# Patient Record
Sex: Female | Born: 1950
Health system: Southern US, Community
[De-identification: ages and names within clinical notes are randomized; demographics above are authoritative.]

## PROBLEM LIST (undated history)

## (undated) DIAGNOSIS — E559 Vitamin D deficiency, unspecified: Secondary | ICD-10-CM

## (undated) DIAGNOSIS — E538 Deficiency of other specified B group vitamins: Secondary | ICD-10-CM

## (undated) DIAGNOSIS — E039 Hypothyroidism, unspecified: Secondary | ICD-10-CM

## (undated) DIAGNOSIS — I2699 Other pulmonary embolism without acute cor pulmonale: Secondary | ICD-10-CM

## (undated) DIAGNOSIS — E7800 Pure hypercholesterolemia, unspecified: Secondary | ICD-10-CM

## (undated) DIAGNOSIS — B029 Zoster without complications: Secondary | ICD-10-CM

## (undated) DIAGNOSIS — K2210 Ulcer of esophagus without bleeding: Secondary | ICD-10-CM

## (undated) DIAGNOSIS — K219 Gastro-esophageal reflux disease without esophagitis: Secondary | ICD-10-CM

## (undated) DIAGNOSIS — O039 Complete or unspecified spontaneous abortion without complication: Secondary | ICD-10-CM

## (undated) HISTORY — DX: Hypothyroidism, unspecified: E03.9

## (undated) HISTORY — DX: Complete or unspecified spontaneous abortion without complication: O03.9

## (undated) HISTORY — DX: Vitamin D deficiency, unspecified: E55.9

## (undated) HISTORY — DX: Deficiency of other specified B group vitamins: E53.8

## (undated) HISTORY — DX: Ulcer of esophagus without bleeding: K22.10

## (undated) HISTORY — DX: Gastro-esophageal reflux disease without esophagitis: K21.9

## (undated) HISTORY — DX: Zoster without complications: B02.9

## (undated) HISTORY — PX: TUBAL LIGATION: SHX77

## (undated) HISTORY — DX: Pure hypercholesterolemia, unspecified: E78.00

## (undated) HISTORY — DX: Other pulmonary embolism without acute cor pulmonale: I26.99

---
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
## 1975-10-14 DIAGNOSIS — I2699 Other pulmonary embolism without acute cor pulmonale: Secondary | ICD-10-CM

## 1975-10-14 HISTORY — DX: Other pulmonary embolism without acute cor pulmonale: I26.99

## 1976-10-13 HISTORY — PX: OTHER SURGICAL HISTORY: SHX169

## 1977-10-13 HISTORY — PX: BOWEL RESECTION: SHX1257

## 1999-06-14 ENCOUNTER — Other Ambulatory Visit: Admission: RE | Admit: 1999-06-14 | Discharge: 1999-06-14 | Payer: Self-pay | Admitting: Obstetrics and Gynecology

## 1999-09-18 ENCOUNTER — Encounter (INDEPENDENT_AMBULATORY_CARE_PROVIDER_SITE_OTHER): Payer: Self-pay

## 1999-09-18 ENCOUNTER — Other Ambulatory Visit: Admission: RE | Admit: 1999-09-18 | Discharge: 1999-09-18 | Payer: Self-pay | Admitting: Obstetrics and Gynecology

## 1999-11-12 ENCOUNTER — Encounter: Payer: Self-pay | Admitting: Gastroenterology

## 1999-11-12 ENCOUNTER — Encounter: Admission: RE | Admit: 1999-11-12 | Discharge: 1999-11-12 | Payer: Self-pay | Admitting: Gastroenterology

## 2000-07-17 ENCOUNTER — Other Ambulatory Visit: Admission: RE | Admit: 2000-07-17 | Discharge: 2000-07-17 | Payer: Self-pay | Admitting: Obstetrics and Gynecology

## 2001-12-14 ENCOUNTER — Encounter: Admission: RE | Admit: 2001-12-14 | Discharge: 2001-12-14 | Payer: Self-pay | Admitting: Obstetrics and Gynecology

## 2001-12-14 ENCOUNTER — Encounter: Payer: Self-pay | Admitting: Obstetrics and Gynecology

## 2001-12-27 ENCOUNTER — Ambulatory Visit (HOSPITAL_BASED_OUTPATIENT_CLINIC_OR_DEPARTMENT_OTHER): Admission: RE | Admit: 2001-12-27 | Discharge: 2001-12-27 | Payer: Self-pay | Admitting: Dentistry

## 2002-06-21 ENCOUNTER — Other Ambulatory Visit: Admission: RE | Admit: 2002-06-21 | Discharge: 2002-06-21 | Payer: Self-pay | Admitting: Obstetrics and Gynecology

## 2002-06-28 ENCOUNTER — Encounter: Payer: Self-pay | Admitting: Obstetrics and Gynecology

## 2002-06-28 ENCOUNTER — Encounter: Admission: RE | Admit: 2002-06-28 | Discharge: 2002-06-28 | Payer: Self-pay | Admitting: Obstetrics and Gynecology

## 2002-10-13 HISTORY — PX: BUNIONECTOMY: SHX129

## 2003-07-05 ENCOUNTER — Other Ambulatory Visit: Admission: RE | Admit: 2003-07-05 | Discharge: 2003-07-05 | Payer: Self-pay | Admitting: Obstetrics and Gynecology

## 2003-09-19 ENCOUNTER — Encounter: Admission: RE | Admit: 2003-09-19 | Discharge: 2003-09-19 | Payer: Self-pay | Admitting: Obstetrics and Gynecology

## 2004-08-16 ENCOUNTER — Other Ambulatory Visit: Admission: RE | Admit: 2004-08-16 | Discharge: 2004-08-16 | Payer: Self-pay | Admitting: Obstetrics and Gynecology

## 2005-08-27 ENCOUNTER — Other Ambulatory Visit: Admission: RE | Admit: 2005-08-27 | Discharge: 2005-08-27 | Payer: Self-pay | Admitting: Obstetrics and Gynecology

## 2005-09-30 ENCOUNTER — Encounter: Admission: RE | Admit: 2005-09-30 | Discharge: 2005-09-30 | Payer: Self-pay | Admitting: Obstetrics and Gynecology

## 2005-11-04 ENCOUNTER — Ambulatory Visit: Payer: Self-pay | Admitting: Oncology

## 2006-08-27 ENCOUNTER — Other Ambulatory Visit: Admission: RE | Admit: 2006-08-27 | Discharge: 2006-08-27 | Payer: Self-pay | Admitting: Obstetrics and Gynecology

## 2006-10-27 ENCOUNTER — Encounter: Admission: RE | Admit: 2006-10-27 | Discharge: 2006-10-27 | Payer: Self-pay | Admitting: Obstetrics and Gynecology

## 2007-08-31 ENCOUNTER — Other Ambulatory Visit: Admission: RE | Admit: 2007-08-31 | Discharge: 2007-08-31 | Payer: Self-pay | Admitting: Obstetrics and Gynecology

## 2008-01-26 ENCOUNTER — Encounter: Admission: RE | Admit: 2008-01-26 | Discharge: 2008-01-26 | Payer: Self-pay | Admitting: Obstetrics and Gynecology

## 2008-09-13 ENCOUNTER — Other Ambulatory Visit: Admission: RE | Admit: 2008-09-13 | Discharge: 2008-09-13 | Payer: Self-pay | Admitting: Obstetrics and Gynecology

## 2009-09-17 ENCOUNTER — Other Ambulatory Visit: Admission: RE | Admit: 2009-09-17 | Discharge: 2009-09-17 | Payer: Self-pay | Admitting: Obstetrics and Gynecology

## 2010-09-16 ENCOUNTER — Encounter: Admission: RE | Admit: 2010-09-16 | Discharge: 2010-09-16 | Payer: Self-pay | Admitting: Internal Medicine

## 2011-02-28 NOTE — Op Note (Signed)
[place]  Patient:    GWENNA, FUSTON Visit Number: [id number] MRN: [id number]          Service Type: DSU Location: [facility] Attending Physician:  Sharin Grave Dictated by:   Cherly Anderson, D.D.S. Proc. Date: 12/27/01 Admit Date:  12/27/2001                             Operative Report  PREOPERATIVE DIAGNOSIS:  Partial right maxillary edentulism, periodontal disease.  POSTOPERATIVE DIAGNOSIS:  Partial right maxillary edentulism, periodontal disease.  PROCEDURES: 1. Harvest of autologous bone from right iliac crest. 2. Right sinus lift procedure. 3. Gingival augmentation of edentulous tooth area #7 with Alloderm.  SURGEON:  Cherly Anderson, D.D.S.  ANESTHESIA:  General anesthesia.  INDICATIONS FOR PROCEDURE:  The patient has been evaluated for dental reconstruction of the right maxilla where she is edentulous.  She is missing teeth posterior to tooth #6 in the right maxilla and she is also edentulous in the area of tooth #7.  The right maxillary sinus is completely ______ to the alveolar ridge and the alveolar ridge has undergone a significant amount of vertical space from the right maxillary alveolar ridge to the imposing dentition.  Following consultations with myself and her general dentist, we explained to Emanie that there is inadequate vertical bone in the right maxillary posterior region for placement of dental implants.  Considering the fact that there is also a significant amount of vertical space from the alveolar ridge to the imposing dentition, she would require augmentation to increase the height of the right maxilla with a sinus lift and would also require onlay bone grafting to the right posterior maxillary alveolus to decrease the vertical space and, therefore, provide more aesthetic dental implant prostheses.  Because the onlay bone grafting would require better vascularity for more predictable success of that procedure,  the sinus lift procedure would have to be performed with autogenous bone which would provide a more vascular bone graft more quickly.  It was determined that she would have the bone harvested from the right iliac crest combined with platelet root to plasma, as well as free stride bone to augment the right maxillary sinus. Once adequate time for healing was allowed, she would then have the alveolar ridge augmented with a cortical graft to be completed approximately six months from the time of the sinus lift procedure.  Evaluation of edentulous area #7 also was found to have inadequate soft tissue for optimal aesthetics in a pontic region of tooth #7.  She has had other procedures previously to vertically augment the soft tissue.  I explained to her that vertically augmenting the soft tissues are very difficult and we would try to augment the soft tissue with an Alloderm graft.  She understands the limited predictability of this procedure, however wishes to proceed to achieve any increase in aesthetics if possible.  DESCRIPTION OF PROCEDURE:  The patient was brought to the operating room and placed in the supine position.  Once general anesthesia was induced via nasotracheal intubation, the patient was prepped and draped for a maxillofacial procedure of this type.  Initially, the right hip region was isolated.  The anterior iliac spine was palpated and identified with a marker. The iliac crest was also palpated and identified with a marker.  The skin was then retracted medially and superiorly to allow for lateral placement of the incision and again the iliac spine and crest  were identified with marks.  Two centimeters posterior to the anterior iliac spine, the incision was initiated and continued posteriorly and superiorly approximately 5 cm.  This incision was made with a #15 blade, continued through the skin and subcutaneous tissue. Bleeding was controlled with electrocautery Bovie.  The  incision continued with repeated palpation of the iliac crest down through fascia through the periosteum along the crest in the mid crestal region.  An incision was made through the periosteum approximately 4 cm in length, again making sure that the incision stopped approximately 2 cm posterior to the anterior iliac spine. Minimal reflection of the periosteum medially was created maintaining soft tissue attachment on the medial surface of the iliac crest.  Osteotomes were then utilized to create osteotomies approximately 2 cm apart.  These osteotomies continued to a depth of approximately 15 mm.  The osteotomies were then connected with osteotomy along the mid crestal region.  The medial cortex bone was then outfractured thus exposing the cancellous bone.  Gouges and scoops were then utilized to harvest the cancellous bone totalling approximately 5-6 cc of the autogenous bone.  Care was taken to avoid perforation of the medial and lateral cortex.  No significant bleeding was encountered.  The site was irrigated with normal saline and suctioned dry. The site was then evaluated and found to have minimal oozing of blood. Therefore, Avitene was placed in the iliac crest region and the cortical bone was then reapproximated and sutured to the iliac crest utilizing 2-0 Vicryl suture.  The fascial layers were then closed utilizing 3-0 Vicryl suture.  The deep subcutaneous tissue was closed utilizing 3-0 Vicryl suture.  The more superficial subcutaneous tissue was also closed utilizing 4-0 Vicryl suture. A subcuticular suture consisting of 5-0 Prolene was then utilized to close the skin.  This concluded the procedure regarding the harvesting of the autogenous graft.  Prior to initiation of harvesting the graft, approximately 60 cc of blood was collected from the patient from the right antecubital fossa.  This blood was then placed into a special centrifuge to spin and create layers of the blood so  that the platelet-rich plasma layer could be harvested.  The platelet-rich plasma was then placed into delivery device and placed in the field to be used  following preparation of the sinus lift.  Attention was then directed toward the right maxillary sinus lift procedure. A #15 blade was utilized to create an incision through the gingiva posterior to tooth #6 with an anterior and posterior releasing site.  A subperiosteal flap was created superiorly exposing the zygomatic buttress and right infraorbital neurovascular bundle.  The tissue was reflected and a rotary osteotome with copious amounts of normal saline irrigation was utilized to create a window in the lateral wall of the sinus.  Curets were then utilized to infracture this bony wall and to gently tease the sinus membrane from around the bony window and continuing anteriorly to the anterior extent at the maxillary sinus and along the floor of the maxillary sinus attempting to carefully reflect the tissue without damaging it.  A 1 cm hole in the membrane was noted and the tissue was again reflected along the medial wall of the maxillary sinus and tissue was maintained superiorly.  A biomend membrane was soaked in the platelet-rich plasma and cut to fit passively within the maxillary sinus.  Carefully reflecting the damaged membrane superiorly, the hole was repaired utilizing the biomend membrane with platelet-rich plasma. Once in place, the entire sinus was  visualized and found to have no fragments of the membrane and the hole was repaired successfully.  The 5.5 cc of the autogenous bone from the hip was then mixed with 2 cc of porous freeze-dried bone and 2 cc of ______.  Once this mixture was accomplished, the bone graft mixture was then impregnated with the platelet-rich plasma.  Once this was allowed to set and congeal, the graft material was again coated on the opposite side.  The graft material was then placed carefully into  the right maxillary sinus compressing the graft material initially in the anterior and inferior portions of the maxillary sinus.  Graft material was then carefully placed in the posterior direction again compressing the graft material medially and inferiorly.  The graft material was measured and found to have created approximately 15 mm of height from the alveolar ridge.  No holes in the repaired sinus membrane were noted.  A second piece of biomend membrane was then soaked in platelet-rich plasma and cut to fit across the bony window in the lateral wall of the maxillary sinus.  This was stabilized utilizing KLS cruciform screws approximately 3 mm in length.  Three screws were placed.  The site was gently irrigated with normal saline to remove any loose bone graft material and the soft tissue was then released and closed passively over this surgical site utilizing 4-0 Chromic suture.  Attention was then directed toward the alloderm gingival augmentation procedure area #7.  A multiple unit dental bridge was removed carefully attempting to avoid damage to the porcelain.  The edentulous area of tooth #7 was incised midcrestal with extensions to the sulcular region of both teeth #6 and #8.  The flap was gently reflected and a split-thickness flap was created both in the facial and palatal surfaces in this region.  The small soft tissue pocket was evaluated and found to be adequate for replacement of the alloderm graft material.  The alloderm graft material was also soaked in platelet-rich plasma and then gently placed within the soft tissue pocket overlying the ridge and continued to the palatal surface.  The soft tissue was then closed utilizing 4-0 Chromic suture.  Once all the oral procedures were completed, the right hip graft site was again evaluated and found to have no excessive bleeding.  Sterile gloves were utilized to apply Bacitracin to the incision site.  Steri-Strips were  then placed and a Telfa pad was used to cover the site.  A pressure dressing was then placed utilizing elastoplast material.  It should be noted that proximally, 1.5 cc of 0.5% Marcaine was injected in the right hip site preoperatively and 3 cc of 0.5% Marcaine with 1:200,000 was also injected into the right maxillary region prior to initiation of that procedure.  The procedure was concluded.  The oropharyngeal throat pack was removed.  The patient was allowed to awaken in the operating room and extubated without complications.  She was then transferred to recovery room in stable, satisfactory condition. Dictated by:   Cherly Anderson, D.D.S. Attending Physician:  Sharin Grave DD:  12/27/01 TD:  12/28/01 Job: [id number]

## 2011-09-29 ENCOUNTER — Other Ambulatory Visit: Payer: Self-pay | Admitting: Internal Medicine

## 2011-09-29 DIAGNOSIS — Z1231 Encounter for screening mammogram for malignant neoplasm of breast: Secondary | ICD-10-CM

## 2011-11-20 ENCOUNTER — Other Ambulatory Visit: Payer: Self-pay | Admitting: Obstetrics & Gynecology

## 2011-11-20 DIAGNOSIS — Z1231 Encounter for screening mammogram for malignant neoplasm of breast: Secondary | ICD-10-CM

## 2011-12-03 ENCOUNTER — Ambulatory Visit
Admission: RE | Admit: 2011-12-03 | Discharge: 2011-12-03 | Disposition: A | Discharge status: Home / Self Care | Payer: BC Managed Care – PPO | Source: Ambulatory Visit | Attending: Obstetrics & Gynecology | Admitting: Obstetrics & Gynecology

## 2011-12-03 DIAGNOSIS — Z1231 Encounter for screening mammogram for malignant neoplasm of breast: Secondary | ICD-10-CM

## 2012-10-13 DIAGNOSIS — B029 Zoster without complications: Secondary | ICD-10-CM

## 2012-10-13 HISTORY — DX: Zoster without complications: B02.9

## 2012-10-29 ENCOUNTER — Other Ambulatory Visit: Payer: Self-pay | Admitting: Obstetrics & Gynecology

## 2012-10-29 DIAGNOSIS — Z1231 Encounter for screening mammogram for malignant neoplasm of breast: Secondary | ICD-10-CM

## 2012-12-03 ENCOUNTER — Ambulatory Visit: Payer: BC Managed Care – PPO

## 2012-12-20 ENCOUNTER — Ambulatory Visit
Admission: RE | Admit: 2012-12-20 | Discharge: 2012-12-20 | Disposition: A | Discharge status: Home / Self Care | Payer: BC Managed Care – PPO | Source: Ambulatory Visit | Attending: Obstetrics & Gynecology | Admitting: Obstetrics & Gynecology

## 2013-03-24 ENCOUNTER — Ambulatory Visit (INDEPENDENT_AMBULATORY_CARE_PROVIDER_SITE_OTHER): Payer: BC Managed Care – PPO | Admitting: Neurology

## 2013-03-24 ENCOUNTER — Encounter: Payer: Self-pay | Admitting: Neurology

## 2013-03-24 VITALS — BP 114/76 | HR 62 | Temp 98.5°F | Ht 66.0 in | Wt 150.0 lb

## 2013-03-24 DIAGNOSIS — G478 Other sleep disorders: Secondary | ICD-10-CM

## 2013-03-24 DIAGNOSIS — G4710 Hypersomnia, unspecified: Secondary | ICD-10-CM

## 2013-03-24 NOTE — Assessment & Plan Note (Signed)
Supine AHi was over 20- [employer].

## 2013-03-24 NOTE — Progress Notes (Signed)
[facility]  Provider:  Dr Vickey Huger Referring Provider: No ref. provider found Primary Care Physician:  Ezequiel Kayser, MD  Chief Complaint  Patient presents with  . Follow-up    central sleep apnea, rm 10    HPI:   Veronica [name] is a 62 y.o. female here as a referral from Dr.Perini  for hypersomnia, persistent.   She was seen  07-21-12 for an initial consult, underwent a sleep study on  08-17-12 , which revealed an AHI of 6.0, mostly obstructive -and  RDI of 10.5 /hr. ,at an  Epworth of 17.  High PLM index  61 /hr a but never had clinical symptom  and was surprised to learn about this.  She  Had mild anemia, was started on Metanx and iron supplements po, but had no relief of hypersomnia. She intended and succeeded with weight loss. Was 159 pounds pre PSG and now 145 pounds . She uses prn Ambien to go to sleep , about 3-4 times a month. Feels anxious often, and  is very detail oriented, has high expectations.    Patient describes her bedtime habit as follows, she goes to bed about midnight and wakes up around 7 AM spontaneously rarely needs the alarm. She will have one bathroom break between 2:30 and 3 AM, and sometimes is difficult for her to be initiate sleep. She likes to take 20 minutes nap, feels refreshed.  Average sleep time  Now is 6.5 hours.  Has been averaging a sleep time of 4-5 hours for most of her life, felt refreshed .No vivid dreams, nightmares, no dream intrusions, cataplexy , narcoleptic attacks.    Her sleepiness may have been present for the last 4 years also. Her rheumatalgical panels were normal, TSH, Hgb A1c , and  Sed rate, CK. , ANA. She has no shift work history and no history of childhood was scored age hypersomnia.no retrognathia, no neck injuries, no tonsillary hypertrophy, nasal airflow restriction.  She has 3 children and her youngest son suffers from sleep apnea and has a CPAP. He has great trouble initiating sleep , is chronically  insomniac . He underwent many ENT procedures, is now 89.      Out of a complete 14 system review, the patient complains of only the following symptoms, and all other reviewed systems are negative.   Epworth 17 points again.     History   Social History  . Marital Status: Married    Spouse Name: N/A    Number of Children: 2  . Years of Education: 14   Occupational History  . retired     Therapist, sports   Social History Main Topics  . Smoking status: Former Games developer  . Smokeless tobacco: Not on file     Comment: quit in 1978  . Alcohol Use: Yes     Comment: 2 drinks per week  . Drug Use: No  . Sexually Active: Not on file   Other Topics Concern  . Not on file   Social History Narrative  . No narrative on file    Family History  Problem Relation Age of Onset  . Transient ischemic attack Mother   . Arthritis Mother   . Hypertension Mother   . Ulcers Mother   . COPD Mother   . Cancer - Lung Father   . Diabetes Sister     whooping cough  . Diabetes Paternal Aunt   . Diabetes Paternal Uncle   . Cancer Paternal Grandmother  esophageal    Past Medical History  Diagnosis Date  . Vitamin B12 deficiency     due to small intestine resection, malabsorption , on shots  . High cholesterol   . Miscarriage     C3P2  . Menarche     age 89 and menopause at age 43  . Erosive esophagitis     on nexium since, with good GERD relief  . GERD (gastroesophageal reflux disease)     Past Surgical History  Procedure Laterality Date  . Appendectomy  1968  . Laparascopy  1978  . Bowel resection  1979    for scar tissue and bowel obstruction.near junction of colon and small bowel per patient  . Bunionectomy  2004    scars on both feet    Current Outpatient Prescriptions  Medication Sig Dispense Refill  . Cyanocobalamin 1000 MCG SUBL Place under the tongue. One injection per month      . levothyroxine (SYNTHROID) 50 MCG tablet Take 50 mcg by mouth daily before  breakfast. daily      . valACYclovir (VALTREX) 1000 MG tablet Take 1,000 mg by mouth daily as needed.      . zolpidem (AMBIEN) 10 MG tablet Take 10 mg by mouth at bedtime as needed for sleep. Take 1/2 tablet daily       No current facility-administered medications for this visit.    Allergies as of 03/24/2013  . (Not on File)    Vitals: BP 114/76  Pulse 62  Temp(Src) 98.5 F (36.9 C) (Oral)  Ht 5\' 6"  (1.676 m)  Wt 150 lb (68.04 kg)  BMI 24.22 kg/m2 Last Weight:  Wt Readings from Last 1 Encounters:  03/24/13 150 lb (68.04 kg)   Last Height:   Ht Readings from Last 1 Encounters:  03/24/13 5\' 6"  (1.676 m)     Physical exam:  General: The patient is awake, alert and appears not in acute distress. The patient is well groomed. Head: Normocephalic, atraumatic. Neck is supple. Mallampati 2 , neck circumference: 12.25 inches , Becks score was 10 . Cardiovascular:  Regular rate and rhythm, without  murmurs or carotid bruit, and without distended neck veins. Respiratory: Lungs are clear to auscultation. Skin:  Without evidence of edema, or rash Trunk: BMI is normal posture.  Neurologic exam : The patient is awake and alert, oriented to place and time.  Memory subjective  described as intact. There is a normal attention span & concentration ability. Speech is fluent without   dysarthria, dysphonia or aphasia. Mood and affect are appropriate.  Cranial nerves: Pupils are equal and briskly reactive to light. Funduscopic exam without   evidence of pallor or edema. Extraocular movements  in vertical and horizontal planes intact and without nystagmus. Visual fields by finger perimetry are intact. Hearing to finger rub intact.  Facial sensation intact to fine touch. Facial motor strength is symmetric and tongue and uvula move midline.  Motor exam:   Normal tone and normal muscle bulk and symmetric normal strength in all extremities.  Sensory:  Fine touch, pinprick and vibration were  tested in all extremities. Proprioception is tested in the upper extremities only. This was   normal.  Coordination: Rapid alternating movements in the fingers/hands is tested and normal. Finger-to-nose maneuver tested and normal without evidence of ataxia, dysmetria or tremor.  Gait and station: Patient walks without assistive device and is able and assisted stool climb up to the exam table. Strength within normal limits. Stance is stable and normal.  Tandem gait is unfragmented. \  Deep tendon reflexes: in the  upper and lower extremities are symmetric and intact. Babinski maneuver response is  downgoing.   Assessment:  After physical and neurologic examination, review of laboratory studies, imaging, neurophysiology testing and pre-existing records, assessment will be reviewed on the problem list.   Persistent hypersomnia with an Epworth sleepiness score are again at 17 points, no symptomatic evidence of restless legs or PLM disorder, In spite of the high PLM index during her sleep study.  Minor AHI , some UARS- sleeps on the side, avoid supine. Tennisball methode explained. No bruxism , but  snoring  Is present - she will consider a dental device if the Tennisball doesn't work.    Plan:  Treatment plan : see above . Patient declined Nuvigil.

## 2013-03-24 NOTE — Patient Instructions (Signed)
Hypersomnia Hypersomnia usually brings recurrent episodes of excessive daytime sleepiness or prolonged nighttime sleep. It is different than feeling tired due to lack of or interrupted sleep at night. People with hypersomnia are compelled to nap repeatedly during the day. This is often at inappropriate times such as:  At work.  During a meal.  In conversation. These daytime naps usually provide no relief. This disorder typically affects adolescents and young adults. CAUSES  This condition may be caused by:  Another sleep disorder (such as narcolepsy or sleep apnea).  Dysfunction of the autonomic nervous system.  Drug or alcohol abuse.  A physical problem, such as:  A tumor.  Head trauma. This is damage caused by an accident.  Injury to the central nervous system.  Certain medications, or medicine withdrawal.  Medical conditions may contribute to the disorder, including:  Multiple sclerosis.  Depression.  Encephalitis.  Epilepsy.  Obesity.  Some people appear to have a genetic predisposition to this disorder. In others, there is no known cause. SYMPTOMS   Patients often have difficulty waking from a long sleep. They may feel dazed or confused.  Other symptoms may include:  Anxiety.  Increased irritation (inflammation).  Decreased energy.  Restlessness.  Slow thinking.  Slow speech.  Loss of appetite.  Hallucinations.  Memory difficulty.  Tremors, Tics.  Some patients lose the ability to function in family, social, occupational, or other settings. TREATMENT  Treatment is symptomatic in nature. Stimulants and other drugs may be used to treat this disorder. Changes in behavior may help. For example, avoid night work and social activities that delay bed time. Changes in diet may offer some relief. Patients should avoid alcohol and caffeine. PROGNOSIS  The likely outcome (prognosis) for persons with hypersomnia depends on the cause of the disorder.  The disorder itself is not life threatening. But it can have serious consequences. For example, automobile accidents can be caused by falling asleep while driving. The attacks usually continue indefinitely. Document Released: 09/19/2002 Document Revised: 12/22/2011 Document Reviewed: 08/23/2008 [employer] Patient Information 2014 [employer].  

## 2013-07-08 ENCOUNTER — Encounter: Payer: Self-pay | Admitting: Obstetrics & Gynecology

## 2013-11-02 ENCOUNTER — Encounter: Payer: Self-pay | Admitting: Obstetrics & Gynecology

## 2014-02-01 ENCOUNTER — Ambulatory Visit: Payer: Self-pay | Admitting: Obstetrics & Gynecology

## 2014-02-03 ENCOUNTER — Ambulatory Visit: Payer: Self-pay | Admitting: Obstetrics & Gynecology

## 2014-02-13 ENCOUNTER — Ambulatory Visit (INDEPENDENT_AMBULATORY_CARE_PROVIDER_SITE_OTHER): Payer: BC Managed Care – PPO | Admitting: Obstetrics & Gynecology

## 2014-02-13 ENCOUNTER — Encounter: Payer: Self-pay | Admitting: Obstetrics & Gynecology

## 2014-02-13 VITALS — BP 104/58 | HR 60 | Resp 16 | Ht 65.25 in | Wt 132.0 lb

## 2014-02-13 DIAGNOSIS — Z0000 Encounter for general adult medical examination without abnormal findings: Secondary | ICD-10-CM

## 2014-02-13 DIAGNOSIS — Z124 Encounter for screening for malignant neoplasm of cervix: Secondary | ICD-10-CM

## 2014-02-13 DIAGNOSIS — Z01419 Encounter for gynecological examination (general) (routine) without abnormal findings: Secondary | ICD-10-CM

## 2014-02-13 LAB — POCT URINALYSIS DIPSTICK
BILIRUBIN UA: NEGATIVE
Glucose, UA: NEGATIVE
Ketones, UA: NEGATIVE
LEUKOCYTES UA: NEGATIVE
NITRITE UA: NEGATIVE
PH UA: 5
PROTEIN UA: NEGATIVE
Urobilinogen, UA: NEGATIVE

## 2014-02-13 MED ORDER — ZOLPIDEM TARTRATE 1.75 MG SL SUBL: 1.0000 | SUBLINGUAL_TABLET | Freq: Every evening | SUBLINGUAL | Status: DC | PRN

## 2014-02-13 MED ORDER — VALACYCLOVIR HCL 1 G PO TABS: 1000.0000 mg | ORAL_TABLET | Freq: Every day | ORAL | Status: DC | PRN

## 2014-02-13 NOTE — Progress Notes (Signed)
63 y.o. G2P2 Widowed CaucasianF here for annual exam.  Husband died in plane crash last year.  Was piloting his own private plane.  He asked patient to fly with him that day.  She does have some guilt about this.  Patient had shingles two weeks after her husband died.  Reports she is going to [place] to be with step daughter.  Her step-son died of a heroine overdose yesterday.  Just having a really hard year.  Seeing a counselor because of the added stress of the year.  Sleeping is ok.  Being very careful about sleeping medication.  She is aware that it would not take very much for her to use more medication that she feels comfortable with.  So, she doesn't take any.    PCP is Dr. Joylene Draft.  Doing paleo diet.  Has lost some weight due to this and some due to husband's death.  Patient's last menstrual period was 10/13/2002.          Sexually active: no  The current method of family planning is tubal ligation.    Exercising: yes  walking, strength training, and yoga Smoker:  Former smoker  Health Maintenance: Pap:  11/13/11 WNL/negative HR HPV History of abnormal Pap:  no MMG:  12/20/12 normal, knows is due Colonoscopy:  2012-repeat in 5 years BMD:   2014 TDaP:  Within the last 2-3 years with Dr Joylene Draft Screening Labs: with Dr. Joylene Draft, Hb today: 13.2, Urine today: RBC-trace   reports that she has quit smoking. She has never used smokeless tobacco. She reports that she drinks about 1 - 1.5 ounces of alcohol per week. She reports that she does not use illicit drugs.  Past Medical History  Diagnosis Date  . Vitamin B12 deficiency     due to small intestine resection, malabsorption , on shots  . High cholesterol   . Miscarriage     C3P2  . Menarche     age 60 and menopause at age 53  . Erosive esophagitis     on nexium since, with good GERD relief  . GERD (gastroesophageal reflux disease)   . Hypothyroid   . PE (pulmonary embolism) 1977    secondary falling down stairs with subsequent  renal failure  . Vitamin D deficiency   . Shingles 2014    Past Surgical History  Procedure Laterality Date  . Appendectomy  1968  . Laparascopy  1978  . Bowel resection  1979    for scar tissue and bowel obstruction.near junction of colon and small bowel per patient  . Bunionectomy  2004    scars on both feet  . Tubal ligation      Current Outpatient Prescriptions  Medication Sig Dispense Refill  . Cyanocobalamin 1000 MCG SUBL Place under the tongue. One injection per month      . levothyroxine (SYNTHROID) 50 MCG tablet Take 50 mcg by mouth daily before breakfast. daily      . valACYclovir (VALTREX) 1000 MG tablet Take 1,000 mg by mouth daily as needed.      . Vitamin D, Ergocalciferol, (DRISDOL) 50000 UNITS CAPS capsule 50,000 Units once a week.      . zolpidem (AMBIEN) 10 MG tablet Take 10 mg by mouth at bedtime as needed for sleep. Take 1/2 tablet daily       No current facility-administered medications for this visit.    Family History  Problem Relation Age of Onset  . Transient ischemic attack Mother   .  Arthritis Mother   . Hypertension Mother   . Ulcers Mother   . COPD Mother   . Cancer - Lung Father   . Diabetes Sister     whooping cough  . Diabetes Paternal Aunt   . Diabetes Paternal Uncle   . Cancer Paternal Grandmother     esophageal  . Hypertension Father   . Hypertension Maternal Grandmother   . Hypertension Paternal Grandmother   . Heart disease Maternal Grandmother   . Hypothyroidism Mother   . Hypothyroidism Sister   . Depression Other     maternal side  . Depression Other     paternal side    ROS:  Pertinent items are noted in HPI.  Otherwise, a comprehensive ROS was negative.  Exam:   BP 104/58  Pulse 60  Resp 16  Ht 5' 5.25" (1.657 m)  Wt 132 lb (59.875 kg)  BMI 21.81 kg/m2  LMP 10/13/2002  Weight change: - 13# Height: 5' 5.25" (165.7 cm)  Ht Readings from Last 3 Encounters:  02/13/14 5' 5.25" (1.657 m)  03/24/13 5\' 6"  (1.676 m)   03/24/13 5\' 6"  (1.676 m)    General appearance: alert, cooperative and appears stated age Head: Normocephalic, without obvious abnormality, atraumatic Neck: no adenopathy, supple, symmetrical, trachea midline and thyroid normal to inspection and palpation Lungs: clear to auscultation bilaterally Breasts: normal appearance, no masses or tenderness Heart: regular rate and rhythm Abdomen: soft, non-tender; bowel sounds normal; no masses,  no organomegaly Extremities: extremities normal, atraumatic, no cyanosis or edema Skin: Skin color, texture, turgor normal. No rashes or lesions Lymph nodes: Cervical, supraclavicular, and axillary nodes normal. No abnormal inguinal nodes palpated Neurologic: Grossly normal   Pelvic: External genitalia:  no lesions              Urethra:  normal appearing urethra with no masses, tenderness or lesions              Bartholins and Skenes: normal                 Vagina: normal appearing vagina with normal color and discharge, no lesions              Cervix: no lesions              Pap taken: yes Bimanual Exam:  Uterus:  normal size, contour, position, consistency, mobility, non-tender              Adnexa: normal adnexa and no mass, fullness, tenderness               Rectovaginal: Confirms               Anus:  normal sphincter tone, no lesions  A:  Well Woman with normal exam PMP, no HRT Insomnia HSV 2 Shingles earlier this year. Hypothyroidism H/O small bowel resection--subsequent B12 deficiency H/o PE 1977 after accident when she feel down the stairs (coagulopathy testing negative) Vit D deficiency  P:   Mammogram due.  Pt aware.  Will schedule. Rx given for Intermezzo (zopidem) 1.75mg  sublingual to be used prn insomnia.  #30/2RD pap smear obtained today.  Neg HR HPV 2013.   Labs with Dr. Joylene Draft. Rx for Valtrex 1gm--2 tabs then repeat 12 hours later.  #30/2RF. return annually or prn  An After Visit Summary was printed and given to the  patient.

## 2014-02-13 NOTE — Patient Instructions (Signed)

## 2014-02-14 LAB — HEMOGLOBIN, FINGERSTICK: Hemoglobin, fingerstick: 13.2 g/dL (ref 12.0–16.0)

## 2014-02-15 LAB — IPS PAP SMEAR ONLY

## 2014-04-18 ENCOUNTER — Encounter: Payer: Self-pay | Admitting: Gynecology

## 2014-04-18 ENCOUNTER — Telehealth: Payer: Self-pay | Admitting: Obstetrics & Gynecology

## 2014-04-18 ENCOUNTER — Ambulatory Visit (INDEPENDENT_AMBULATORY_CARE_PROVIDER_SITE_OTHER): Payer: BC Managed Care – PPO | Admitting: Gynecology

## 2014-04-18 VITALS — BP 122/72 | Resp 20 | Ht 65.25 in | Wt 134.0 lb

## 2014-04-18 DIAGNOSIS — N644 Mastodynia: Secondary | ICD-10-CM

## 2014-04-18 NOTE — Telephone Encounter (Signed)
Spoke with patient at time of incoming call.  Patient states that a few weeks ago, she was getting ready to go to bed at night and had what she describes as "Searing pain in my left Breast." This pain she feels is directly on her L breast, does not radiate. Pain in breast subsided that night but has been ongoing, just less intensity. Patient states L Breast is tender to touch and has pain with movement. Wearing tighter bras helps with pain. No redness or swelling.  Scheduled office visit with Dr. Charlies Constable for today 04/18/14 at 1600. She is agreeable to this. Patient is due for Mammogram as well.   Routing to provider for final review. Patient agreeable to disposition. Will close encounter  cc Dr. Sabra Heck

## 2014-04-18 NOTE — Progress Notes (Signed)
Subjective:     Patient ID: Veronica [name], female   DOB: March 17, 1951, 63 y.o.   MRN: [id number]  HPI Comments: Pt reports feeling a sharp, stabbing pain in left breast that she noticed when she was getting ready for bed.  She states that the pain is less intense but present.  Pt wears a sports bra to get relief.  Took no otc medications. No color discoloration or nipple discharge. Pt usually does yoga but has not done recently. Pt does routine breast exams Pt states that she had a shingles out break in 2/15 after her husband was killed in a plane crash.  Cannot recall if lesions extended under breast but was on back.  Pt took valtrex for 2w.  Pt still has some discomfort.     Review of Systems  Constitutional: Negative for chills and fatigue.  HENT: Negative for congestion and postnasal drip.   Respiratory: Negative for cough.   Skin: Negative for color change, rash and wound.       Objective:   Physical Exam  Nursing note and vitals reviewed. Pulmonary/Chest: Right breast exhibits no inverted nipple, no mass, no nipple discharge, no skin change and no tenderness. Left breast exhibits tenderness. Left breast exhibits no inverted nipple, no mass, no nipple discharge and no skin change. Breasts are symmetrical.    Lymphadenopathy:       Right axillary: No pectoral and no lateral adenopathy present.       Left axillary: No lateral adenopathy present.      Right: No supraclavicular adenopathy present.       Left: No supraclavicular adenopathy present.       Assessment:     Left breast tenderness     Plan:     Dx mammogram bilateral U/s left

## 2014-04-18 NOTE — Progress Notes (Signed)
Appointment made for bilateral dx Mammogram with L Breast US for [facility] imaging. Appointment made with patient in office for 05/02/14 at 1100 patient agreeable to time/date.

## 2014-04-18 NOTE — Telephone Encounter (Signed)
Patient calling to report left breast pain. She noticed it a couple of weeks ago and described it as "seering pain." "It is still very tender to the touch and moving, very uncomfortable." Please advise.

## 2014-04-19 ENCOUNTER — Ambulatory Visit: Payer: BC Managed Care – PPO | Admitting: Gynecology

## 2014-05-02 ENCOUNTER — Ambulatory Visit
Admission: RE | Admit: 2014-05-02 | Discharge: 2014-05-02 | Disposition: A | Discharge status: Home / Self Care | Payer: BC Managed Care – PPO | Source: Ambulatory Visit | Attending: Gynecology | Admitting: Gynecology

## 2014-05-02 DIAGNOSIS — N644 Mastodynia: Secondary | ICD-10-CM

## 2014-08-14 ENCOUNTER — Encounter: Payer: Self-pay | Admitting: Gynecology

## 2014-09-20 ENCOUNTER — Telehealth: Payer: Self-pay | Admitting: Emergency Medicine

## 2014-09-20 NOTE — Telephone Encounter (Signed)
-----   Message from Elveria Rising, MD sent at 07/24/2014  8:10 AM EDT ----- Regarding: RE: Mammogram hold  yes ----- Message -----    From: Karen Chafe, RN    Sent: 07/17/2014   9:57 AM      To: Azalia Bilis, MD Subject: Mammogram hold                                 Dr. Charlies Constable, okay to remove from mammogram hold?

## 2015-03-08 ENCOUNTER — Ambulatory Visit (INDEPENDENT_AMBULATORY_CARE_PROVIDER_SITE_OTHER): Payer: BLUE CROSS/BLUE SHIELD | Admitting: Obstetrics & Gynecology

## 2015-03-08 ENCOUNTER — Encounter: Payer: Self-pay | Admitting: Obstetrics & Gynecology

## 2015-03-08 VITALS — BP 98/60 | HR 64 | Resp 14 | Ht 65.6 in | Wt 132.0 lb

## 2015-03-08 DIAGNOSIS — Z01419 Encounter for gynecological examination (general) (routine) without abnormal findings: Secondary | ICD-10-CM

## 2015-03-08 DIAGNOSIS — Z0000 Encounter for general adult medical examination without abnormal findings: Secondary | ICD-10-CM

## 2015-03-08 LAB — POCT URINALYSIS DIPSTICK
BILIRUBIN UA: NEGATIVE
GLUCOSE UA: NEGATIVE
KETONES UA: NEGATIVE
Leukocytes, UA: NEGATIVE
NITRITE UA: NEGATIVE
PH UA: 6.5
PROTEIN UA: NEGATIVE
SPEC GRAV UA: 1.01
UROBILINOGEN UA: NEGATIVE

## 2015-03-08 LAB — LIPID PANEL
Cholesterol: 230 mg/dL — ABNORMAL HIGH (ref 0–200)
HDL: 68 mg/dL (ref >=46)
LDL Cholesterol: 135 mg/dL — ABNORMAL HIGH (ref 0–99)
TRIGLYCERIDES: 136 mg/dL (ref <150)
Total CHOL/HDL Ratio: 3.4 Ratio
VLDL: 27 mg/dL (ref 0–40)

## 2015-03-08 LAB — TSH: TSH: 3.307 u[IU]/mL (ref 0.350–4.500)

## 2015-03-08 LAB — COMPREHENSIVE METABOLIC PANEL
ALT: 15 U/L (ref 0–35)
AST: 22 U/L (ref 0–37)
Albumin: 4.5 g/dL (ref 3.5–5.2)
Alkaline Phosphatase: 67 U/L (ref 39–117)
BILIRUBIN TOTAL: 0.9 mg/dL (ref 0.2–1.2)
BUN: 14 mg/dL (ref 6–23)
CHLORIDE: 104 meq/L (ref 96–112)
CO2: 30 mEq/L (ref 19–32)
Calcium: 10 mg/dL (ref 8.4–10.5)
Creat: 0.86 mg/dL (ref 0.50–1.10)
GLUCOSE: 95 mg/dL (ref 70–99)
Potassium: 4.6 mEq/L (ref 3.5–5.3)
Sodium: 143 mEq/L (ref 135–145)
Total Protein: 6.8 g/dL (ref 6.0–8.3)

## 2015-03-08 MED ORDER — VALACYCLOVIR HCL 1 G PO TABS: 1000.0000 mg | ORAL_TABLET | Freq: Every day | ORAL | Status: DC | PRN

## 2015-03-08 MED ORDER — ZOLPIDEM TARTRATE 1.75 MG SL SUBL: 1.0000 | SUBLINGUAL_TABLET | Freq: Every evening | SUBLINGUAL | Status: DC | PRN

## 2015-03-08 MED ORDER — VITAMIN D (ERGOCALCIFEROL) 1.25 MG (50000 UNIT) PO CAPS: 50000.0000 [IU] | ORAL_CAPSULE | ORAL | Status: DC

## 2015-03-08 NOTE — Progress Notes (Signed)
Patient ID: Veronica [name], female   DOB: [birth year], 64 y.o.   MRN: [id number]   64 y.o. G2P2 WidowedCaucasianF here for annual exam.   Last year, her husband died in airplane crash.  Her step-daughter had a son who died with a heroine overdose.  She has her first grandson who is three weeks old.  They live in [place].    Pt is sleeping better.  She reports she is not watching Netflix 18 hours a day so that is progress.    Patient's last menstrual period was 10/13/2002.          Sexually active: No.  The current method of family planning is post menopausal status.    Exercising: Yes.    weights and walking Smoker:  no  Health Maintenance: Pap:  02-13-14 WNL  History of abnormal Pap:  no MMG:  05-02-14 WNL, neg HR HPV 2013 Colonoscopy:  2012 repeat in 5 years BMD:   2014 TDaP: with-in last 8 years Screening Labs: labs drawn today , Hb today: 13.3, Urine today: trace RBC   reports that she has quit smoking. She has never used smokeless tobacco. She reports that she drinks about 2.4 oz of alcohol per week. She reports that she does not use illicit drugs.  Past Medical History  Diagnosis Date  . Vitamin B12 deficiency     due to small intestine resection, malabsorption , on shots  . High cholesterol   . Miscarriage     C3P2  . Menarche     age 33 and menopause at age 4  . Erosive esophagitis     on nexium since, with good GERD relief  . GERD (gastroesophageal reflux disease)   . Hypothyroid   . PE (pulmonary embolism) 1977    secondary falling down stairs with subsequent renal failure  . Vitamin D deficiency   . Shingles 2014    Past Surgical History  Procedure Laterality Date  . Appendectomy  1968  . Laparascopy  1978  . Bowel resection  1979    for scar tissue and bowel obstruction.near junction of colon and small bowel per patient  . Bunionectomy  2004    scars on both feet  . Tubal ligation      Current Outpatient Prescriptions  Medication Sig Dispense Refill  .  Cyanocobalamin 1000 MCG SUBL Place under the tongue. One injection per month    . levothyroxine (SYNTHROID) 50 MCG tablet Take 50 mcg by mouth daily before breakfast. daily    . valACYclovir (VALTREX) 1000 MG tablet Take 1 tablet (1,000 mg total) by mouth daily as needed. 2 tablets x 1, repeat in 12 hours for total of four tablets with onset of symptoms 30 tablet 1  . VYVANSE 20 MG capsule   0  . zolpidem (AMBIEN) 10 MG tablet Take 10 mg by mouth at bedtime as needed for sleep. Take 1/2 tablet daily    . Zolpidem Tartrate (INTERMEZZO) 1.75 MG SUBL Place 1 tablet under the tongue at bedtime as needed. 30 tablet 2  . Vitamin D, Ergocalciferol, (DRISDOL) 50000 UNITS CAPS capsule 50,000 Units once a week.     No current facility-administered medications for this visit.    Family History  Problem Relation Age of Onset  . Transient ischemic attack Mother   . Arthritis Mother   . Hypertension Mother   . Ulcers Mother   . COPD Mother   . Cancer - Lung Father   . Diabetes Sister  whooping cough  . Diabetes Paternal Aunt   . Diabetes Paternal Uncle   . Cancer Paternal Grandmother     esophageal  . Hypertension Father   . Hypertension Maternal Grandmother   . Hypertension Paternal Grandmother   . Heart disease Maternal Grandmother   . Hypothyroidism Mother   . Hypothyroidism Sister   . Depression Other     maternal side  . Depression Other     paternal side    ROS:  Pertinent items are noted in HPI.  Otherwise, a comprehensive ROS was negative.  Exam:   BP 98/60 mmHg  Pulse 64  Resp 14  Ht 5' 5.6" (1.666 m)  Wt 132 lb (59.875 kg)  BMI 21.57 kg/m2  LMP 10/13/2002  Weight change: stable  Height: 5' 5.6" (166.6 cm)  Ht Readings from Last 3 Encounters:  03/08/15 5' 5.6" (1.666 m)  04/18/14 5' 5.25" (1.657 m)  02/13/14 5' 5.25" (1.657 m)    General appearance: alert, cooperative and appears stated age Head: Normocephalic, without obvious abnormality, atraumatic Neck: no  adenopathy, supple, symmetrical, trachea midline and thyroid normal to inspection and palpation Lungs: clear to auscultation bilaterally Breasts: normal appearance, no masses or tenderness Heart: regular rate and rhythm Abdomen: soft, non-tender; bowel sounds normal; no masses,  no organomegaly Extremities: extremities normal, atraumatic, no cyanosis or edema Skin: Skin color, texture, turgor normal. No rashes or lesions Lymph nodes: Cervical, supraclavicular, and axillary nodes normal. No abnormal inguinal nodes palpated Neurologic: Grossly normal   Pelvic: External genitalia:  no lesions              Urethra:  normal appearing urethra with no masses, tenderness or lesions              Bartholins and Skenes: normal                 Vagina: normal appearing vagina with normal color and discharge, no lesions              Cervix: absent              Pap taken: No. Bimanual Exam:  Uterus:  normal size, contour, position, consistency, mobility, non-tender              Adnexa: normal adnexa and no mass, fullness, tenderness               Rectovaginal: Confirms               Anus:  normal sphincter tone, no lesions  Chaperone was present for exam.  A:  Well Woman with normal exam PMP, no HRT Insomnia HSV 2 Hypothyroidism H/O small bowel resection--subsequent B12 deficiency H/o PE 1977 after accident when she feel down the stairs (coagulopathy testing negative) Vit D deficiency Appropriate grief reaction  P: Mammogram yearly. Rx given for Intermezzo (zopidem) 1.75mg  sublingual to be used prn insomnia. #30/2RF No pap today.  Neg pap 2015. Neg HR HPV 2013.  Labs with Dr. Joylene Draft but will check lipids, TSH, Vit D, CMP.  Pt will sign release for labs to do to her as well as Dr. Joylene Draft. Rx for Valtrex 1gm--2 tabs then repeat 12 hours later. #30/2RF. Vit D 50k weekly.  #12/4RF return annually or prn

## 2015-03-09 ENCOUNTER — Telehealth: Payer: Self-pay | Admitting: *Deleted

## 2015-03-09 LAB — VITAMIN D 25 HYDROXY (VIT D DEFICIENCY, FRACTURES): VIT D 25 HYDROXY: 31 ng/mL (ref 30–100)

## 2015-03-09 NOTE — Telephone Encounter (Signed)
Patient notified see result note 

## 2015-03-09 NOTE — Telephone Encounter (Signed)
-----   Message from Megan Salon, MD sent at 03/09/2015  4:35 AM EDT ----- Please inform total cholesterol 230 and LDL 135.  These are elevated but not in a range where treatment would be recommended.  CMP normal.  TSH normal.  Vit D 31 but goal is 40.  She will restart her Vit D and prescription was sent to the pharmacy.  Pt did sign release for labs to be released to herself and asked for these to be sent to Dr. Joylene Draft.  After calling pt, can sent to [facility] to confirm we have release.

## 2015-03-09 NOTE — Telephone Encounter (Signed)
Returning a call to Jasmine. °

## 2015-03-09 NOTE — Telephone Encounter (Signed)
Left Message To Call Back  

## 2015-03-13 LAB — HEMOGLOBIN, FINGERSTICK: Hemoglobin, fingerstick: 13.2 g/dL (ref 12.0–16.0)

## 2015-03-19 ENCOUNTER — Telehealth: Payer: Self-pay

## 2015-03-19 NOTE — Telephone Encounter (Signed)
-----   Message from Megan Salon, MD sent at 03/19/2015 10:28 AM EDT ----- Can you give her a call and let her know she is not active yet on MyChart.  She will need to sign a release for [place] to give her the labs if she wants them.  Thanks.

## 2015-03-19 NOTE — Telephone Encounter (Signed)
Lmtcb//kn 

## 2015-05-07 NOTE — Telephone Encounter (Signed)
Patient aware-will call if needs any help signing up for MyChart or if she needs to sign release to receive results.//kn

## 2016-05-23 ENCOUNTER — Encounter: Payer: Self-pay | Admitting: Obstetrics & Gynecology

## 2016-05-23 ENCOUNTER — Ambulatory Visit (INDEPENDENT_AMBULATORY_CARE_PROVIDER_SITE_OTHER): Payer: Medicare Other | Admitting: Obstetrics & Gynecology

## 2016-05-23 VITALS — BP 100/68 | HR 64 | Resp 16 | Ht 65.5 in | Wt 139.0 lb

## 2016-05-23 DIAGNOSIS — E785 Hyperlipidemia, unspecified: Secondary | ICD-10-CM | POA: Diagnosis not present

## 2016-05-23 DIAGNOSIS — E538 Deficiency of other specified B group vitamins: Secondary | ICD-10-CM | POA: Diagnosis not present

## 2016-05-23 DIAGNOSIS — Z01419 Encounter for gynecological examination (general) (routine) without abnormal findings: Secondary | ICD-10-CM | POA: Diagnosis not present

## 2016-05-23 DIAGNOSIS — Z205 Contact with and (suspected) exposure to viral hepatitis: Secondary | ICD-10-CM | POA: Diagnosis not present

## 2016-05-23 DIAGNOSIS — Z124 Encounter for screening for malignant neoplasm of cervix: Secondary | ICD-10-CM

## 2016-05-23 DIAGNOSIS — Z0000 Encounter for general adult medical examination without abnormal findings: Secondary | ICD-10-CM | POA: Diagnosis not present

## 2016-05-23 DIAGNOSIS — R899 Unspecified abnormal finding in specimens from other organs, systems and tissues: Secondary | ICD-10-CM

## 2016-05-23 DIAGNOSIS — E559 Vitamin D deficiency, unspecified: Secondary | ICD-10-CM | POA: Diagnosis not present

## 2016-05-23 DIAGNOSIS — E039 Hypothyroidism, unspecified: Secondary | ICD-10-CM

## 2016-05-23 DIAGNOSIS — E038 Other specified hypothyroidism: Secondary | ICD-10-CM

## 2016-05-23 LAB — LIPID PANEL
CHOLESTEROL: 194 mg/dL (ref 125–200)
HDL: 87 mg/dL (ref >=46)
LDL CALC: 72 mg/dL (ref <130)
TRIGLYCERIDES: 176 mg/dL — AB (ref <150)
Total CHOL/HDL Ratio: 2.2 Ratio (ref <=5.0)
VLDL: 35 mg/dL — ABNORMAL HIGH (ref <30)

## 2016-05-23 LAB — TSH: TSH: 5.43 mIU/L — ABNORMAL HIGH

## 2016-05-23 LAB — CBC
HEMATOCRIT: 41.9 % (ref 35.0–45.0)
Hemoglobin: 13.9 g/dL (ref 11.7–15.5)
MCH: 32.3 pg (ref 27.0–33.0)
MCHC: 33.2 g/dL (ref 32.0–36.0)
MCV: 97.2 fL (ref 80.0–100.0)
MPV: 9.8 fL (ref 7.5–12.5)
Platelets: 257 10*3/uL (ref 140–400)
RBC: 4.31 MIL/uL (ref 3.80–5.10)
RDW: 13.2 % (ref 11.0–15.0)
WBC: 5.6 10*3/uL (ref 3.8–10.8)

## 2016-05-23 MED ORDER — ZOLPIDEM TARTRATE 1.75 MG SL SUBL: 1.0000 | SUBLINGUAL_TABLET | Freq: Every evening | SUBLINGUAL | 1 refills | Status: DC | PRN

## 2016-05-23 MED ORDER — VALACYCLOVIR HCL 1 G PO TABS: 1000.0000 mg | ORAL_TABLET | Freq: Every day | ORAL | 1 refills | Status: DC | PRN

## 2016-05-23 NOTE — Progress Notes (Addendum)
Called Dr. Lorie Apley office. Pt had Colonoscopy and EGD 02/2012. Due for next Colonoscopy and EGD 02/2017. - Pt notified.

## 2016-05-23 NOTE — Progress Notes (Signed)
65 y.o. G2P2 WidowedCaucasianF here for annual exam.  Husband died 69 in airplane crash.  Daughter is expecting another child in November.    Denies vaginal bleeding.    D/w pt Hep C testing.  She has hx of blood transfusion with hb 6.0.  This was in 1992.    PCP:  Dr. Joylene Draft.  She has appt with him in October.    Patient's last menstrual period was 10/13/2002.          Sexually active: No.  The current method of family planning is post menopausal status.    Exercising: Yes.    Walking and personal trainer  Smoker:  no  Health Maintenance: Pap:  02/13/14 Neg  History of abnormal Pap:  no MMG:  05/02/14 Diagnostic Bilateral BIRADS1:neg Colonoscopy:  2012 f/u 5 years  BMD:   2014  TDaP: w/ PCP Pneumonia vaccine(s):  w/PCP Zostavax:   2015 Hep C testing:  Done today Screening Labs: TSH, Lipids, Hb today: 13.5   reports that she has quit smoking. She has never used smokeless tobacco. She reports that she drinks about 2.4 oz of alcohol per week . She reports that she does not use drugs.  Past Medical History:  Diagnosis Date  . Erosive esophagitis    on nexium since, with good GERD relief  . GERD (gastroesophageal reflux disease)   . High cholesterol   . Hypothyroid   . Menarche    age 67 and menopause at age 79  . Miscarriage    C3P2  . PE (pulmonary embolism) 1977   secondary falling down stairs with subsequent renal failure  . Shingles 2014  . Vitamin B12 deficiency    due to small intestine resection, malabsorption , on shots  . Vitamin D deficiency     Past Surgical History:  Procedure Laterality Date  . APPENDECTOMY  1968  . BOWEL RESECTION  1979   for scar tissue and bowel obstruction.near junction of colon and small bowel per patient  . BUNIONECTOMY  2004   scars on both feet  . LAPARASCOPY  1978  . TUBAL LIGATION      Current Outpatient Prescriptions  Medication Sig Dispense Refill  . Cyanocobalamin 1000 MCG SUBL Place under the tongue. One injection  every 3 weeks    . valACYclovir (VALTREX) 1000 MG tablet Take 1 tablet (1,000 mg total) by mouth daily as needed. 2 tablets x 1, repeat in 12 hours for total of four tablets with onset of symptoms 30 tablet 1  . Zolpidem Tartrate (INTERMEZZO) 1.75 MG SUBL Place 1 tablet under the tongue at bedtime as needed. 30 tablet 2   No current facility-administered medications for this visit.     Family History  Problem Relation Age of Onset  . Transient ischemic attack Mother   . Arthritis Mother   . Hypertension Mother   . Ulcers Mother   . COPD Mother   . Hypothyroidism Mother   . Cancer - Lung Father   . Hypertension Father   . Diabetes Sister     whooping cough  . Diabetes Paternal Aunt   . Diabetes Paternal Uncle   . Cancer Paternal Grandmother     esophageal  . Hypertension Paternal Grandmother   . Hypertension Maternal Grandmother   . Heart disease Maternal Grandmother   . Hypothyroidism Sister   . Depression Other     maternal side  . Depression Other     paternal side    ROS:  Pertinent  items are noted in HPI.  Otherwise, a comprehensive ROS was negative.  Exam:   BP 100/68 (BP Location: Right Arm, Patient Position: Sitting, Cuff Size: Normal)   Pulse 64   Resp 16   Ht 5' 5.5" (1.664 m)   Wt 139 lb (63 kg)   LMP 10/13/2002   BMI 22.78 kg/m     Height: 5' 5.5" (166.4 cm)  Ht Readings from Last 3 Encounters:  05/23/16 5' 5.5" (1.664 m)  03/08/15 5' 5.6" (1.666 m)  04/18/14 5' 5.25" (1.657 m)    General appearance: alert, cooperative and appears stated age Head: Normocephalic, without obvious abnormality, atraumatic Neck: no adenopathy, supple, symmetrical, trachea midline and thyroid normal to inspection and palpation Lungs: clear to auscultation bilaterally Breasts: normal appearance, no masses or tenderness Heart: regular rate and rhythm Abdomen: soft, non-tender; bowel sounds normal; no masses,  no organomegaly Extremities: extremities normal, atraumatic, no  cyanosis or edema Skin: Skin color, texture, turgor normal. No rashes or lesions Lymph nodes: Cervical, supraclavicular, and axillary nodes normal. No abnormal inguinal nodes palpated Neurologic: Grossly normal   Pelvic: External genitalia:  no lesions              Urethra:  normal appearing urethra with no masses, tenderness or lesions              Bartholins and Skenes: normal                 Vagina: normal appearing vagina with normal color and discharge, no lesions              Cervix: no lesions              Pap taken: Yes.   Bimanual Exam:  Uterus:  normal size, contour, position, consistency, mobility, non-tender              Adnexa: normal adnexa and no mass, fullness, tenderness               Rectovaginal: Confirms               Anus:  normal sphincter tone, no lesions  Chaperone was present for exam.  A:   Well Woman with normal exam PMP, no HRT Insomnia HSV 2.  Only keeps rx on hand.   Hypothyroidism H/o small bowel resection--subsequent B12 deficiency H/o PE 1977 after accident when she feel down the stairs (coagulopathy testing negative) Vit D deficiency  P: Mammogram yearly. Rx given for Intermezzo (zopidem) 1.75mg  sublingual to be used prn insomnia. #30/1RF Pap obtained today. Neg HR HPV 2013.  Labs with Dr. Joylene Draft but will check lipids, TSH, Vit D, CBC, TSH, Vit D, Lipids Hep C testing today. Rx for Valtrex 1gm--2 tabs then repeat 12 hours later. #30/2RF. Return annually or prn

## 2016-05-24 LAB — HEPATITIS C ANTIBODY: HCV Ab: NEGATIVE

## 2016-05-24 LAB — VITAMIN D 25 HYDROXY (VIT D DEFICIENCY, FRACTURES): VIT D 25 HYDROXY: 27 ng/mL — AB (ref 30–100)

## 2016-05-26 LAB — HEMOGLOBIN, FINGERSTICK: HEMOGLOBIN, FINGERSTICK: 13.5 g/dL (ref 12.0–16.0)

## 2016-05-26 LAB — IPS PAP SMEAR ONLY

## 2016-05-29 ENCOUNTER — Telehealth: Payer: Self-pay | Admitting: *Deleted

## 2016-05-29 NOTE — Addendum Note (Signed)
Addended by: Megan Salon on: 05/29/2016 09:51 AM   Modules accepted: Orders

## 2016-05-29 NOTE — Telephone Encounter (Signed)
Left message per DPR that patient's last colonoscopy was in 02/2012 and her repeat will be 02/2017. Instructed patient to call with any questions.  Routing to provider for final review. Patient agreeable to disposition. Will close encounter.

## 2016-05-30 ENCOUNTER — Telehealth: Payer: Self-pay | Admitting: *Deleted

## 2016-05-30 NOTE — Telephone Encounter (Signed)
-----   Message from Megan Salon, MD sent at 05/29/2016  9:49 AM EDT ----- Please inform pt that her pap was normal.  02 recall.  Also, inform pt that Hep C testing was negative.  Also, her CBC was normal.  Her Vit D was a little low at 27.  She should be taking 1000 IU OTC Vit D.   Will repeat this level next year.  Her TSH was mildly elevated.  Typically, I repeat this in 2 or 3 months before deciding if thyroid supplementation is needed.  Order placed for future lab.  Please schedule.    Lastly, her cholesterol is ok.  Total 194 (goal <200).  Triglycerides were mildly increased but this can just be watched.  Nothing else is needed for this at this time.    Thanks.

## 2016-05-30 NOTE — Telephone Encounter (Signed)
Message left to return call to Daphnie Venturini at [phone].    

## 2016-06-05 NOTE — Telephone Encounter (Signed)
Message left to return call to Emily at [phone].    

## 2016-06-09 ENCOUNTER — Encounter: Payer: Self-pay | Admitting: *Deleted

## 2016-06-10 NOTE — Telephone Encounter (Signed)
Signed letter mailed to patient with results per Dr. Sabra Heck.    Routing to provider for final review. Will close encounter.

## 2016-06-26 DIAGNOSIS — M2011 Hallux valgus (acquired), right foot: Secondary | ICD-10-CM | POA: Diagnosis not present

## 2016-06-26 DIAGNOSIS — M7742 Metatarsalgia, left foot: Secondary | ICD-10-CM | POA: Diagnosis not present

## 2016-06-26 DIAGNOSIS — M2012 Hallux valgus (acquired), left foot: Secondary | ICD-10-CM | POA: Diagnosis not present

## 2016-06-26 DIAGNOSIS — G5762 Lesion of plantar nerve, left lower limb: Secondary | ICD-10-CM | POA: Diagnosis not present

## 2016-07-01 DIAGNOSIS — G5762 Lesion of plantar nerve, left lower limb: Secondary | ICD-10-CM | POA: Diagnosis not present

## 2016-07-01 DIAGNOSIS — E538 Deficiency of other specified B group vitamins: Secondary | ICD-10-CM | POA: Diagnosis not present

## 2016-07-23 DIAGNOSIS — E538 Deficiency of other specified B group vitamins: Secondary | ICD-10-CM | POA: Diagnosis not present

## 2016-07-31 DIAGNOSIS — E559 Vitamin D deficiency, unspecified: Secondary | ICD-10-CM | POA: Diagnosis not present

## 2016-07-31 DIAGNOSIS — E038 Other specified hypothyroidism: Secondary | ICD-10-CM | POA: Diagnosis not present

## 2016-07-31 DIAGNOSIS — E538 Deficiency of other specified B group vitamins: Secondary | ICD-10-CM | POA: Diagnosis not present

## 2016-07-31 DIAGNOSIS — E784 Other hyperlipidemia: Secondary | ICD-10-CM | POA: Diagnosis not present

## 2016-08-07 DIAGNOSIS — H2513 Age-related nuclear cataract, bilateral: Secondary | ICD-10-CM | POA: Diagnosis not present

## 2016-08-07 DIAGNOSIS — H01005 Unspecified blepharitis left lower eyelid: Secondary | ICD-10-CM | POA: Diagnosis not present

## 2016-08-07 DIAGNOSIS — H01001 Unspecified blepharitis right upper eyelid: Secondary | ICD-10-CM | POA: Diagnosis not present

## 2016-08-07 DIAGNOSIS — H01002 Unspecified blepharitis right lower eyelid: Secondary | ICD-10-CM | POA: Diagnosis not present

## 2016-08-07 DIAGNOSIS — H01004 Unspecified blepharitis left upper eyelid: Secondary | ICD-10-CM | POA: Diagnosis not present

## 2016-08-07 DIAGNOSIS — H524 Presbyopia: Secondary | ICD-10-CM | POA: Diagnosis not present

## 2016-08-27 DIAGNOSIS — E559 Vitamin D deficiency, unspecified: Secondary | ICD-10-CM | POA: Diagnosis not present

## 2016-08-27 DIAGNOSIS — F4320 Adjustment disorder, unspecified: Secondary | ICD-10-CM | POA: Diagnosis not present

## 2016-08-27 DIAGNOSIS — Z23 Encounter for immunization: Secondary | ICD-10-CM | POA: Diagnosis not present

## 2016-08-27 DIAGNOSIS — F900 Attention-deficit hyperactivity disorder, predominantly inattentive type: Secondary | ICD-10-CM | POA: Diagnosis not present

## 2016-08-27 DIAGNOSIS — H9193 Unspecified hearing loss, bilateral: Secondary | ICD-10-CM | POA: Diagnosis not present

## 2016-08-27 DIAGNOSIS — Z0000 Encounter for general adult medical examination without abnormal findings: Secondary | ICD-10-CM | POA: Diagnosis not present

## 2016-08-27 DIAGNOSIS — Z1389 Encounter for screening for other disorder: Secondary | ICD-10-CM | POA: Diagnosis not present

## 2016-08-27 DIAGNOSIS — E038 Other specified hypothyroidism: Secondary | ICD-10-CM | POA: Diagnosis not present

## 2016-08-27 DIAGNOSIS — R5383 Other fatigue: Secondary | ICD-10-CM | POA: Diagnosis not present

## 2016-08-27 DIAGNOSIS — E784 Other hyperlipidemia: Secondary | ICD-10-CM | POA: Diagnosis not present

## 2016-08-27 DIAGNOSIS — Z6822 Body mass index (BMI) 22.0-22.9, adult: Secondary | ICD-10-CM | POA: Diagnosis not present

## 2016-08-27 DIAGNOSIS — H7290 Unspecified perforation of tympanic membrane, unspecified ear: Secondary | ICD-10-CM | POA: Diagnosis not present

## 2016-08-27 DIAGNOSIS — D538 Other specified nutritional anemias: Secondary | ICD-10-CM | POA: Diagnosis not present

## 2016-09-23 DIAGNOSIS — E538 Deficiency of other specified B group vitamins: Secondary | ICD-10-CM | POA: Diagnosis not present

## 2016-10-03 ENCOUNTER — Telehealth: Payer: Self-pay | Admitting: *Deleted

## 2016-10-03 NOTE — Telephone Encounter (Signed)
-----   Message from Megan Salon, MD sent at 10/03/2016  6:54 AM EST ----- Regarding: lab work Please call pt and remind her that I wanted to recheck a TSH.  She may have already done this with Dr. Joylene Draft and if this is the case, I will cancel the lab order.  Thanks.  Vinnie Level

## 2016-10-03 NOTE — Telephone Encounter (Signed)
Message left to return call to Emily at [phone].    

## 2016-10-14 NOTE — Telephone Encounter (Signed)
Patient returning your call.

## 2016-10-14 NOTE — Telephone Encounter (Signed)
Message left to return call to Karalyn Kadel at [phone].    

## 2016-10-16 ENCOUNTER — Other Ambulatory Visit: Payer: Self-pay | Admitting: Obstetrics & Gynecology

## 2016-10-16 NOTE — Telephone Encounter (Signed)
Returned call to patient. Patient states that she had her thyroid levels check with Dr. Joylene Draft back in 10/17 at her physical. Patient states she will call his office and ask them to send a copy to Dr. Sabra Heck so she can review. Patient states different providers have different opinions about things, so she would like Dr. Sabra Heck to review the TSH level since she is not currently taking any thyroid medicine. RN advised this message would be sent to Dr. Sabra Heck and our office would call with any additional recommendations.   Routing to provider for review.

## 2016-10-23 DIAGNOSIS — Z780 Asymptomatic menopausal state: Secondary | ICD-10-CM | POA: Diagnosis not present

## 2016-10-23 DIAGNOSIS — E538 Deficiency of other specified B group vitamins: Secondary | ICD-10-CM | POA: Diagnosis not present

## 2017-01-06 DIAGNOSIS — E538 Deficiency of other specified B group vitamins: Secondary | ICD-10-CM | POA: Diagnosis not present

## 2017-01-21 DIAGNOSIS — L814 Other melanin hyperpigmentation: Secondary | ICD-10-CM | POA: Diagnosis not present

## 2017-01-21 DIAGNOSIS — L570 Actinic keratosis: Secondary | ICD-10-CM | POA: Diagnosis not present

## 2017-01-21 DIAGNOSIS — D225 Melanocytic nevi of trunk: Secondary | ICD-10-CM | POA: Diagnosis not present

## 2017-01-21 DIAGNOSIS — L661 Lichen planopilaris: Secondary | ICD-10-CM | POA: Diagnosis not present

## 2017-01-21 DIAGNOSIS — L821 Other seborrheic keratosis: Secondary | ICD-10-CM | POA: Diagnosis not present

## 2017-01-21 DIAGNOSIS — L853 Xerosis cutis: Secondary | ICD-10-CM | POA: Diagnosis not present

## 2017-02-10 DIAGNOSIS — E038 Other specified hypothyroidism: Secondary | ICD-10-CM | POA: Diagnosis not present

## 2017-02-10 DIAGNOSIS — E538 Deficiency of other specified B group vitamins: Secondary | ICD-10-CM | POA: Diagnosis not present

## 2017-02-17 ENCOUNTER — Telehealth: Payer: Self-pay | Admitting: Obstetrics & Gynecology

## 2017-02-17 NOTE — Telephone Encounter (Signed)
Routing to Dr.Miller for review and advise. 

## 2017-02-17 NOTE — Telephone Encounter (Signed)
Patient called to see if Dr. Sabra Heck received her thyroid test of results from Dr. Joylene Draft done last week. She also requested the results done prior to last week be sent to Dr. Sabra Heck for review.

## 2017-02-18 NOTE — Telephone Encounter (Signed)
Call to [facility]. Left a message with records department for return call regarding records request.   Left message for patient to call [place] at [phone].

## 2017-02-18 NOTE — Telephone Encounter (Signed)
I have not seen these results.  Please call and see if [facility] will fax them.

## 2017-02-18 NOTE — Telephone Encounter (Signed)
Patient returned call

## 2017-02-19 NOTE — Telephone Encounter (Signed)
Left message to call Kaitlyn at [phone]. 

## 2017-02-23 NOTE — Telephone Encounter (Signed)
Spoke with patient. Advised patient we have not received results at this time for Dr.Miller's review. Advised will contact Dr.Perini's office again for request of results. Patient states that she will call Dr.Perini's office and request labs to be faxed to [facility].

## 2017-03-23 NOTE — Telephone Encounter (Signed)
Dr.Miller, have you received labs from [facility]?

## 2017-03-26 ENCOUNTER — Telehealth: Payer: Self-pay | Admitting: Obstetrics & Gynecology

## 2017-03-26 NOTE — Telephone Encounter (Signed)
Call to Dr.Perini's office. Left message with medical records requesting return call for release of patient's lab work.   Call to patient. Advised we have not received results from [facility] and are still reaching out to them to obtain records. Patient states she has also contacted their office. Patient has a copy of her labs and will fax them to our office.

## 2017-03-26 NOTE — Telephone Encounter (Signed)
Please see telephone encounter dated 02/17/2017.  Will close encounter.

## 2017-03-26 NOTE — Telephone Encounter (Signed)
Patient called stating she is returning Emily's call about results we should have received from Dr. Joylene Draft.

## 2017-03-31 DIAGNOSIS — E538 Deficiency of other specified B group vitamins: Secondary | ICD-10-CM | POA: Diagnosis not present

## 2017-04-06 NOTE — Telephone Encounter (Signed)
Left message to call Kaitlyn at [phone]. 

## 2017-04-06 NOTE — Telephone Encounter (Signed)
I did receive the records.  Can you please let her know her TSH and free T4 were completely normal?  I would repeat this again at her AEX if that is ok with her.  Thanks.

## 2017-04-07 NOTE — Telephone Encounter (Signed)
Spoke with patient. Advised of message as seen below from [place]. Patient is agreeable to date and time.  Routing to provider for final review. Patient agreeable to disposition. Will close encounter.

## 2017-05-12 DIAGNOSIS — E538 Deficiency of other specified B group vitamins: Secondary | ICD-10-CM | POA: Diagnosis not present

## 2017-07-14 DIAGNOSIS — E538 Deficiency of other specified B group vitamins: Secondary | ICD-10-CM | POA: Diagnosis not present

## 2017-08-07 ENCOUNTER — Telehealth: Payer: Self-pay | Admitting: Obstetrics & Gynecology

## 2017-08-07 NOTE — Telephone Encounter (Signed)
Left patient a message to call back to reschedule a future appointment that was cancelled by the provider for an AEX. °

## 2017-08-10 ENCOUNTER — Ambulatory Visit (INDEPENDENT_AMBULATORY_CARE_PROVIDER_SITE_OTHER): Payer: Medicare Other | Admitting: Obstetrics & Gynecology

## 2017-08-10 ENCOUNTER — Encounter: Payer: Self-pay | Admitting: Obstetrics & Gynecology

## 2017-08-10 ENCOUNTER — Other Ambulatory Visit: Payer: Self-pay | Admitting: Obstetrics & Gynecology

## 2017-08-10 VITALS — BP 116/64 | HR 70 | Resp 14 | Ht 65.25 in | Wt 138.0 lb

## 2017-08-10 DIAGNOSIS — Z1211 Encounter for screening for malignant neoplasm of colon: Secondary | ICD-10-CM | POA: Diagnosis not present

## 2017-08-10 DIAGNOSIS — Z124 Encounter for screening for malignant neoplasm of cervix: Secondary | ICD-10-CM | POA: Diagnosis not present

## 2017-08-10 DIAGNOSIS — Z01419 Encounter for gynecological examination (general) (routine) without abnormal findings: Secondary | ICD-10-CM | POA: Diagnosis not present

## 2017-08-10 DIAGNOSIS — E039 Hypothyroidism, unspecified: Secondary | ICD-10-CM

## 2017-08-10 DIAGNOSIS — R7989 Other specified abnormal findings of blood chemistry: Secondary | ICD-10-CM | POA: Diagnosis not present

## 2017-08-10 DIAGNOSIS — Z1231 Encounter for screening mammogram for malignant neoplasm of breast: Secondary | ICD-10-CM

## 2017-08-10 MED ORDER — ZOLPIDEM TARTRATE 1.75 MG SL SUBL: 1.0000 | SUBLINGUAL_TABLET | Freq: Every evening | SUBLINGUAL | 1 refills | Status: DC | PRN

## 2017-08-10 NOTE — Progress Notes (Signed)
66 y.o. G2P2 WidowedCaucasianF here for annual exam.  Doing well.  Denies vaginal bleeding.    PCP:  Dr. Joylene Draft.  Last visit was in October.  Blood work was done before the appt.  Everything was "pretty good" except cholesterol was mildly elevated.  She does not want to be on statin.  Reports having increased right hip pain that started several months ago and is worsening.  She is waking up at night due to the pain and has some pain with walking up stairs.  Has taken Advil on occasion that does help.  Patient's last menstrual period was 10/13/2002.          Sexually active: No.  The current method of family planning is post menopausal status.    Exercising: Yes.    walking, weight training Smoker:  Former smoker   Health Maintenance: Pap:  05/23/16 negative, 02/13/14 negative  History of abnormal Pap:  no MMG:  05/02/14 BIRADS 1 negative, Grade C breast density Colonoscopy:  02/11/12- repeat 5 years.  Aware this is due.  Would like me to refer her. BMD:   2017 with Dr. Joylene Draft TDaP:  PCP Pneumonia vaccine(s):  PCP Zostavax:   ~2013 Hep C testing: 05/23/16 negative Screening Labs: PCP, Hb today: PCP   reports that she has quit smoking. She has never used smokeless tobacco. She reports that she drinks about 2.4 oz of alcohol per week . She reports that she does not use drugs.  Past Medical History:  Diagnosis Date  . Erosive esophagitis    on nexium since, with good GERD relief  . GERD (gastroesophageal reflux disease)   . High cholesterol   . Hypothyroid   . Menarche    age 8 and menopause at age 73  . Miscarriage    C3P2  . PE (pulmonary embolism) 1977   secondary falling down stairs with subsequent renal failure  . Shingles 2014  . Vitamin B12 deficiency    due to small intestine resection, malabsorption , on shots  . Vitamin D deficiency     Past Surgical History:  Procedure Laterality Date  . APPENDECTOMY  1968  . BOWEL RESECTION  1979   for scar tissue and bowel  obstruction.near junction of colon and small bowel per patient  . BUNIONECTOMY  2004   scars on both feet  . LAPARASCOPY  1978  . TUBAL LIGATION      Current Outpatient Prescriptions  Medication Sig Dispense Refill  . Cyanocobalamin 1000 MCG SUBL Place under the tongue. One injection every 3 weeks    . L-Methylfolate (DEPLIN PO) Take 15 mg by mouth daily.    . valACYclovir (VALTREX) 1000 MG tablet Take 1 tablet (1,000 mg total) by mouth daily as needed. 2 tablets x 1, repeat in 12 hours for total of four tablets with onset of symptoms 30 tablet 1  . VYVANSE 20 MG capsule Take 20 mg by mouth daily.  0  . zolpidem (AMBIEN) 10 MG tablet Take 5 mg by mouth at bedtime.  1  . Zolpidem Tartrate (INTERMEZZO) 1.75 MG SUBL Place 1 tablet under the tongue at bedtime as needed. 30 tablet 1   No current facility-administered medications for this visit.     Family History  Problem Relation Age of Onset  . Transient ischemic attack Mother   . Arthritis Mother   . Hypertension Mother   . Ulcers Mother   . COPD Mother   . Hypothyroidism Mother   . Cancer - Lung  Father   . Hypertension Father   . Diabetes Sister        whooping cough  . Diabetes Paternal Aunt   . Diabetes Paternal Uncle   . Cancer Paternal Grandmother        esophageal  . Hypertension Paternal Grandmother   . Hypertension Maternal Grandmother   . Heart disease Maternal Grandmother   . Hypothyroidism Sister   . Depression Other        maternal side  . Depression Other        paternal side    ROS:  Pertinent items are noted in HPI.  Otherwise, a comprehensive ROS was negative.  Exam:   BP 116/64 (BP Location: Right Arm, Patient Position: Sitting, Cuff Size: Normal)   Pulse 70   Resp 14   Ht 5' 5.25" (1.657 m)   Wt 138 lb (62.6 kg)   LMP 10/13/2002   BMI 22.79 kg/m     Height: 5' 5.25" (165.7 cm)  Ht Readings from Last 3 Encounters:  08/10/17 5' 5.25" (1.657 m)  05/23/16 5' 5.5" (1.664 m)  03/08/15 5' 5.6"  (1.666 m)    General appearance: alert, cooperative and appears stated age Head: Normocephalic, without obvious abnormality, atraumatic Neck: no adenopathy, supple, symmetrical, trachea midline and thyroid normal to inspection and palpation Lungs: clear to auscultation bilaterally Breasts: normal appearance, no masses or tenderness Heart: regular rate and rhythm Abdomen: soft, non-tender; bowel sounds normal; no masses,  no organomegaly Extremities: extremities normal, atraumatic, no cyanosis or edema Skin: Skin color, texture, turgor normal. No rashes or lesions Lymph nodes: Cervical, supraclavicular, and axillary nodes normal. No abnormal inguinal nodes palpated Neurologic: Grossly normal   Pelvic: External genitalia:  no lesions              Urethra:  normal appearing urethra with no masses, tenderness or lesions              Bartholins and Skenes: normal                 Vagina: normal appearing vagina with normal color and discharge, no lesions              Cervix: no lesions              Pap taken: No. Bimanual Exam:  Uterus:  normal size, contour, position, consistency, mobility, non-tender              Adnexa: normal adnexa and no mass, fullness, tenderness               Rectovaginal: Confirms               Anus:  normal sphincter tone, no lesions  Chaperone was present for exam.  A:  Well Woman with normal exam PMP, no HRT Insomnia H/O HSV 2 Hypothyroidism H/oH/O small bowel resection.  Subsequent B12 deficiency H/O PE 1977 after falling down stairs.  Coagulopathy testing negative. Vit D deficiency Hip pain H/O abnormal TSH last year  P:   Mammogram guidelines reviewed.  She will be scheduled for this as she is overdue Refer to Dr. Collene Mares for screening colonoscope pap smear not indicated.  Done 2017 and was neagtive Lab work done with Dr. Joylene Draft TSH will be obtained today Rx for shingrix vaccine given RF for Intermezzo given.  #30/1RF.  Pt uses less than this  yearly. Pt needs to see ortho.  Does not want me to refer her. Return annually or prn

## 2017-08-11 LAB — TSH: TSH: 3.68 u[IU]/mL (ref 0.450–4.500)

## 2017-08-13 ENCOUNTER — Ambulatory Visit: Payer: Self-pay

## 2017-08-13 DIAGNOSIS — H01002 Unspecified blepharitis right lower eyelid: Secondary | ICD-10-CM | POA: Diagnosis not present

## 2017-08-13 DIAGNOSIS — H2513 Age-related nuclear cataract, bilateral: Secondary | ICD-10-CM | POA: Diagnosis not present

## 2017-08-13 DIAGNOSIS — H524 Presbyopia: Secondary | ICD-10-CM | POA: Diagnosis not present

## 2017-08-13 DIAGNOSIS — H01001 Unspecified blepharitis right upper eyelid: Secondary | ICD-10-CM | POA: Diagnosis not present

## 2017-08-13 DIAGNOSIS — H01004 Unspecified blepharitis left upper eyelid: Secondary | ICD-10-CM | POA: Diagnosis not present

## 2017-08-17 ENCOUNTER — Telehealth: Payer: Self-pay | Admitting: Obstetrics & Gynecology

## 2017-08-17 NOTE — Telephone Encounter (Signed)
Dr. Sabra Heck,  I spoke with Veronica [name] at Dr. Lorie [name] office about scheduling colon cancer screening. She states Dr. Collene [name] said it is not time for the patient to have this done. There has been new recommendations(10 years from last one). Patient is not due until May 2023. I have spoken with Ms. Veronica [name] and she is aware and understands the new recommendations.   [employer]

## 2017-08-18 ENCOUNTER — Ambulatory Visit (INDEPENDENT_AMBULATORY_CARE_PROVIDER_SITE_OTHER): Payer: Medicare Other

## 2017-08-18 ENCOUNTER — Ambulatory Visit (INDEPENDENT_AMBULATORY_CARE_PROVIDER_SITE_OTHER): Payer: Medicare Other | Admitting: Orthopaedic Surgery

## 2017-08-18 ENCOUNTER — Ambulatory Visit: Payer: Medicare Other | Admitting: Obstetrics & Gynecology

## 2017-08-18 ENCOUNTER — Encounter (INDEPENDENT_AMBULATORY_CARE_PROVIDER_SITE_OTHER): Payer: Self-pay | Admitting: Orthopaedic Surgery

## 2017-08-18 VITALS — BP 121/75 | HR 73 | Resp 14 | Ht 68.0 in | Wt 140.0 lb

## 2017-08-18 DIAGNOSIS — M25551 Pain in right hip: Secondary | ICD-10-CM | POA: Diagnosis not present

## 2017-08-18 MED ORDER — BUPIVACAINE HCL 0.5 % IJ SOLN
2.0000 mL | INTRAMUSCULAR | Status: AC | PRN
  Administered 2017-08-18: 2 mL via INTRA_ARTICULAR

## 2017-08-18 MED ORDER — METHYLPREDNISOLONE ACETATE 40 MG/ML IJ SUSP
80.0000 mg | INTRAMUSCULAR | Status: AC | PRN
  Administered 2017-08-18: 80 mg

## 2017-08-18 MED ORDER — LIDOCAINE HCL 1 % IJ SOLN
2.0000 mL | INTRAMUSCULAR | Status: AC | PRN
  Administered 2017-08-18: 2 mL

## 2017-08-18 NOTE — Progress Notes (Signed)
Office Visit Note   Patient: Veronica [name]           Date of Birth: 1951/01/30           MRN: [id number] Visit Date: 08/18/2017              Requested by: Veronica Infante, MD [address] [place], [place] [address] PCP: Veronica Infante, MD   Assessment & Plan: Visit Diagnoses:  1. Pain in right hip    Etiology is either greater trochanter pain or referred pain from the right hip. We will initially try a cortisone injection over the area of greatest tenderness about the superior aspect of the greater trochanter and monitor the results Follow-Up Instructions: Return if symptoms worsen or fail to improve.   Orders:  Orders Placed This Encounter  Procedures  . Large Joint Inj: R greater trochanter  . XR HIP UNILAT W OR W/O PELVIS 2-3 VIEWS RIGHT   No orders of the defined types were placed in this encounter.     Procedures: Large Joint Inj: R greater trochanter on 08/18/2017 2:18 PM Indications: pain and diagnostic evaluation Details: 25 G 1.5 in needle  Arthrogram: No  Medications: 2 mL lidocaine 1 %; 2 mL bupivacaine 0.5 %; 80 mg methylPREDNISolone acetate 40 MG/ML Procedure, treatment alternatives, risks and benefits explained, specific risks discussed. Consent was given by the patient. Immediately prior to procedure a time out was called to verify the correct patient, procedure, equipment, support staff and site/side marked as required. Patient was prepped and draped in the usual sterile fashion.       Clinical Data: No additional findings.   Subjective: Chief Complaint  Patient presents with  . Right Hip - Pain    Veronica [name] is a 66 y o that presents with RIght hip pain x 6 months. Denies injury, fever or chills. No calf or groin pain. Pain mainly at the Right bursa area. No back or neck pain.Takes Advil/Aleve if pain is severe.  Insidious onset of right hip pain about April 2018. No history of injury or trauma. Pain is reached a point which is having trouble  sleeping at night. Pain is localized along the lateral aspect of the right hip in the area of the greater trochanter. No back pain or buttock pain. No groin or thigh discomfort. No numbness or tingling. Past history of sciatica on the left which has resolved. Does walk with a limp  HPI  Review of Systems  Constitutional: Negative for chills, fatigue and fever.  Eyes: Negative for itching.  Respiratory: Negative for chest tightness and shortness of breath.   Cardiovascular: Negative for chest pain, palpitations and leg swelling.  Gastrointestinal: Negative for blood in stool, constipation and diarrhea.  Endocrine: Negative for polyuria.  Genitourinary: Negative for dysuria.  Musculoskeletal: Negative for back pain, joint swelling, neck pain and neck stiffness.  Allergic/Immunologic: Negative for immunocompromised state.  Neurological: Negative for dizziness and numbness.  Hematological: Does not bruise/bleed easily.  Psychiatric/Behavioral: Positive for sleep disturbance. The patient is not nervous/anxious.      Objective: Vital Signs: BP 121/75   Pulse 73   Resp 14   Ht 5\' 8"  (1.727 m)   Wt 140 lb (63.5 kg)   LMP 10/13/2002   BMI 21.29 kg/m   Physical Exam  Ortho Exam awake alert and oriented 3. Comfortable sitting. Very minimal limp referable to the right hip. Localized tenderness just superior to the greater trochanter. Skin intact. Straight leg raise negative. Slight  decrease in range of motion right hip compared to left but no read referred pain from the groin or thigh. Neurovascular exam intact. No distal edema.  No specialty comments available.  Imaging: Xr Hip Unilat W Or W/o Pelvis 2-3 Views Right  Result Date: 08/18/2017 Films of the right hip and pelvis were obtained.  Joints are very well maintained.  There is a very small inferior spur right. joint spaces are clean regular.  No ectopic calcification.  Irregularity about the greater trochanters bilaterally pelvis  appears to be intact    PMFS History: Patient Active Problem List   Diagnosis Date Noted  . Abnormal TSH 08/10/2017  . Hypersomnia, persistent 03/24/2013  . UARS (upper airway resistance syndrome) 03/24/2013   Past Medical History:  Diagnosis Date  . Erosive esophagitis    on nexium since, with good GERD relief  . GERD (gastroesophageal reflux disease)   . High cholesterol   . Hypothyroid   . Miscarriage    G3P2  . PE (pulmonary embolism) 1977   secondary falling down stairs with subsequent renal failure  . Shingles 2014  . Vitamin B12 deficiency    due to small intestine resection, malabsorption , on shots  . Vitamin D deficiency     Family History  Problem Relation Age of Onset  . Transient ischemic attack Mother   . Arthritis Mother   . Hypertension Mother   . Ulcers Mother   . COPD Mother   . Hypothyroidism Mother   . Cancer - Lung Father   . Hypertension Father   . Diabetes Sister        whooping cough  . Diabetes Paternal Aunt   . Diabetes Paternal Uncle   . Cancer Paternal Grandmother        esophageal  . Hypertension Paternal Grandmother   . Hypertension Maternal Grandmother   . Heart disease Maternal Grandmother   . Hypothyroidism Sister   . Depression Other        maternal side  . Depression Other        paternal side    Past Surgical History:  Procedure Laterality Date  . APPENDECTOMY  1968  . BOWEL RESECTION  1979   for scar tissue and bowel obstruction.near junction of colon and small bowel per patient  . BUNIONECTOMY  2004   scars on both feet  . LAPARASCOPY  1978  . TUBAL LIGATION     Social History   Occupational History  . 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 Smoking status: Former Research scientist (life sciences)  . Smokeless tobacco: Never Used  . Tobacco comment: quit in 1978  Substance and Sexual Activity  . Alcohol use: Yes    Alcohol/week: 2.4 oz    Types: 4 Standard drinks or equivalent per week  . Drug use: No    . Sexual activity: No    Partners: Male    Birth control/protection: Surgical, Post-menopausal    Comment: BTL

## 2017-09-08 ENCOUNTER — Ambulatory Visit
Admission: RE | Admit: 2017-09-08 | Discharge: 2017-09-08 | Disposition: A | Discharge status: Home / Self Care | Payer: Medicare Other | Source: Ambulatory Visit | Attending: Obstetrics & Gynecology | Admitting: Obstetrics & Gynecology

## 2017-09-08 DIAGNOSIS — E538 Deficiency of other specified B group vitamins: Secondary | ICD-10-CM | POA: Diagnosis not present

## 2017-09-08 DIAGNOSIS — Z1231 Encounter for screening mammogram for malignant neoplasm of breast: Secondary | ICD-10-CM

## 2017-10-19 DIAGNOSIS — E7849 Other hyperlipidemia: Secondary | ICD-10-CM | POA: Diagnosis not present

## 2017-10-19 DIAGNOSIS — E538 Deficiency of other specified B group vitamins: Secondary | ICD-10-CM | POA: Diagnosis not present

## 2017-10-19 DIAGNOSIS — R82998 Other abnormal findings in urine: Secondary | ICD-10-CM | POA: Diagnosis not present

## 2017-10-19 DIAGNOSIS — E038 Other specified hypothyroidism: Secondary | ICD-10-CM | POA: Diagnosis not present

## 2017-10-19 DIAGNOSIS — E559 Vitamin D deficiency, unspecified: Secondary | ICD-10-CM | POA: Diagnosis not present

## 2017-10-26 DIAGNOSIS — H7290 Unspecified perforation of tympanic membrane, unspecified ear: Secondary | ICD-10-CM | POA: Diagnosis not present

## 2017-10-26 DIAGNOSIS — Z1389 Encounter for screening for other disorder: Secondary | ICD-10-CM | POA: Diagnosis not present

## 2017-10-26 DIAGNOSIS — F900 Attention-deficit hyperactivity disorder, predominantly inattentive type: Secondary | ICD-10-CM | POA: Diagnosis not present

## 2017-10-26 DIAGNOSIS — E538 Deficiency of other specified B group vitamins: Secondary | ICD-10-CM | POA: Diagnosis not present

## 2017-10-26 DIAGNOSIS — J029 Acute pharyngitis, unspecified: Secondary | ICD-10-CM | POA: Diagnosis not present

## 2017-10-26 DIAGNOSIS — Z0000 Encounter for general adult medical examination without abnormal findings: Secondary | ICD-10-CM | POA: Diagnosis not present

## 2017-10-26 DIAGNOSIS — R5383 Other fatigue: Secondary | ICD-10-CM | POA: Diagnosis not present

## 2017-10-26 DIAGNOSIS — F4320 Adjustment disorder, unspecified: Secondary | ICD-10-CM | POA: Diagnosis not present

## 2017-10-26 DIAGNOSIS — M2550 Pain in unspecified joint: Secondary | ICD-10-CM | POA: Diagnosis not present

## 2017-10-26 DIAGNOSIS — H6981 Other specified disorders of Eustachian tube, right ear: Secondary | ICD-10-CM | POA: Diagnosis not present

## 2017-10-26 DIAGNOSIS — Z6822 Body mass index (BMI) 22.0-22.9, adult: Secondary | ICD-10-CM | POA: Diagnosis not present

## 2017-10-26 DIAGNOSIS — B029 Zoster without complications: Secondary | ICD-10-CM | POA: Diagnosis not present

## 2017-10-27 ENCOUNTER — Other Ambulatory Visit: Payer: Self-pay | Admitting: Internal Medicine

## 2017-10-27 DIAGNOSIS — R194 Change in bowel habit: Secondary | ICD-10-CM

## 2017-11-18 DIAGNOSIS — E538 Deficiency of other specified B group vitamins: Secondary | ICD-10-CM | POA: Diagnosis not present

## 2017-11-23 ENCOUNTER — Ambulatory Visit
Admission: RE | Admit: 2017-11-23 | Discharge: 2017-11-23 | Disposition: A | Discharge status: Home / Self Care | Payer: Medicare Other | Source: Ambulatory Visit | Attending: Internal Medicine | Admitting: Internal Medicine

## 2017-11-23 DIAGNOSIS — K824 Cholesterolosis of gallbladder: Secondary | ICD-10-CM | POA: Diagnosis not present

## 2017-11-23 DIAGNOSIS — R194 Change in bowel habit: Secondary | ICD-10-CM

## 2017-12-08 DIAGNOSIS — E538 Deficiency of other specified B group vitamins: Secondary | ICD-10-CM | POA: Diagnosis not present

## 2018-01-05 DIAGNOSIS — E538 Deficiency of other specified B group vitamins: Secondary | ICD-10-CM | POA: Diagnosis not present

## 2018-02-09 DIAGNOSIS — E538 Deficiency of other specified B group vitamins: Secondary | ICD-10-CM | POA: Diagnosis not present

## 2018-03-10 DIAGNOSIS — L814 Other melanin hyperpigmentation: Secondary | ICD-10-CM | POA: Diagnosis not present

## 2018-03-10 DIAGNOSIS — E038 Other specified hypothyroidism: Secondary | ICD-10-CM | POA: Diagnosis not present

## 2018-03-10 DIAGNOSIS — E538 Deficiency of other specified B group vitamins: Secondary | ICD-10-CM | POA: Diagnosis not present

## 2018-03-10 DIAGNOSIS — L720 Epidermal cyst: Secondary | ICD-10-CM | POA: Diagnosis not present

## 2018-03-10 DIAGNOSIS — L821 Other seborrheic keratosis: Secondary | ICD-10-CM | POA: Diagnosis not present

## 2018-03-10 DIAGNOSIS — E559 Vitamin D deficiency, unspecified: Secondary | ICD-10-CM | POA: Diagnosis not present

## 2018-03-10 DIAGNOSIS — D1801 Hemangioma of skin and subcutaneous tissue: Secondary | ICD-10-CM | POA: Diagnosis not present

## 2018-03-10 DIAGNOSIS — L819 Disorder of pigmentation, unspecified: Secondary | ICD-10-CM | POA: Diagnosis not present

## 2018-04-13 ENCOUNTER — Telehealth: Payer: Self-pay | Admitting: Obstetrics & Gynecology

## 2018-04-13 DIAGNOSIS — E538 Deficiency of other specified B group vitamins: Secondary | ICD-10-CM | POA: Diagnosis not present

## 2018-04-13 NOTE — Telephone Encounter (Signed)
Left message to call [name] at [phone].  

## 2018-04-13 NOTE — Telephone Encounter (Signed)
Patient called to report blood in her urine. She said she is leaving town after work today and declined an appointment requesting to wait until next week. Routing to triage for further assessment and/or scheduling.

## 2018-04-14 DIAGNOSIS — R319 Hematuria, unspecified: Secondary | ICD-10-CM | POA: Diagnosis not present

## 2018-04-14 NOTE — Telephone Encounter (Signed)
Patient returned call. Patient states for the last 2 weeks, she has had blood in her urine. Patient states at first she was not alarmed because she eats a lot of beets and thought it could be related to that. Patient states she called yesterday because when she went to clean herself after urinating she said, "it was definitely blood." Patient states she has urinated this am and denies noticing any blood. Denies fever. States she is having some back pain, but that she is very active so the pain could be associated with that. RN advised patient would need OV for further evaluation. Patient states that she is currently out of town at [facility]. Patient requesting appointment for next week with Dr. Sabra Heck. RN advised patient could schedule for Monday, but would likely need to be seen sooner, patient agreeable. Patient states she could drive to our office today if Dr. Sabra Heck could see her. RN advised patient would need to schedule with covering provider as Dr. Sabra Heck is out of the office today. Patient declines and states she will be seen by a clinic in [facility] area and will bring records and results to appointment on Monday with Dr. Sabra Heck. Patient agreeable and appreciative of phone call. Patient scheduled for 04-19-18 at 4:00pm with Dr. Sabra Heck. Patient agreeable to date and time of appointment.   Routing to covering provider for final review. Patient agreeable to disposition. Will close encounter.   CC Dr. Sabra Heck

## 2018-04-19 ENCOUNTER — Encounter: Payer: Self-pay | Admitting: Obstetrics & Gynecology

## 2018-04-19 ENCOUNTER — Ambulatory Visit (INDEPENDENT_AMBULATORY_CARE_PROVIDER_SITE_OTHER): Payer: Medicare Other | Admitting: Obstetrics & Gynecology

## 2018-04-19 ENCOUNTER — Other Ambulatory Visit: Payer: Self-pay

## 2018-04-19 VITALS — BP 120/70 | HR 68 | Resp 16 | Ht 68.0 in | Wt 136.8 lb

## 2018-04-19 DIAGNOSIS — R310 Gross hematuria: Secondary | ICD-10-CM | POA: Diagnosis not present

## 2018-04-19 NOTE — Patient Instructions (Signed)
Key E Vit E vaginal suppositories.  Place vaginally twice weekly.  These are the ones on [place].

## 2018-04-19 NOTE — Progress Notes (Signed)
GYNECOLOGY  VISIT  CC:   hematuria  HPI: 67 y.o. G2P2 Widowed Caucasian female here for hematuria that started as a purplish color in her urine about two weeks ago.  Then on 04/13/18, six days ago, after voiding she noted blood on the toilet paper after wiping.   She is training for a walk that she will do in the fall.  This is a 200 mile walk that will take place over 14 days.  She is going to walk part of the [employer].  She has been a life long walker for her exercise.  She is walking about 11 miles a day and she is walking at least four days a week but the goal is 6 days a week.  She is working towards 14 miles a day.    Has met someone and she is interested in being sexually active.  She   GYNECOLOGIC HISTORY: Patient's last menstrual period was 10/13/2002. Contraception: post menopausal  Menopausal hormone therapy: none  Patient Active Problem List   Diagnosis Date Noted  . Abnormal TSH 08/10/2017  . Hypersomnia, persistent 03/24/2013  . UARS (upper airway resistance syndrome) 03/24/2013    Past Medical History:  Diagnosis Date  . Erosive esophagitis    on nexium since, with good GERD relief  . GERD (gastroesophageal reflux disease)   . High cholesterol   . Hypothyroid   . Miscarriage    G3P2  . PE (pulmonary embolism) 1977   secondary falling down stairs with subsequent renal failure  . Shingles 2014  . Vitamin B12 deficiency    due to small intestine resection, malabsorption , on shots  . Vitamin D deficiency     Past Surgical History:  Procedure Laterality Date  . APPENDECTOMY  1968  . BOWEL RESECTION  1979   for scar tissue and bowel obstruction.near junction of colon and small bowel per patient  . BUNIONECTOMY  2004   scars on both feet  . LAPARASCOPY  1978  . TUBAL LIGATION      MEDS:   Current Outpatient Medications on File Prior to Visit  Medication Sig Dispense Refill  . Cyanocobalamin 1000 MCG SUBL Place under the tongue. One injection every 3 weeks     . VYVANSE 20 MG capsule Take 20 mg by mouth daily.  0  . zolpidem (AMBIEN) 10 MG tablet Take 5 mg by mouth at bedtime.  1   No current facility-administered medications on file prior to visit.     ALLERGIES: Codeine; Demerol [meperidine]; and Penicillins  Family History  Problem Relation Age of Onset  . Transient ischemic attack Mother   . Arthritis Mother   . Hypertension Mother   . Ulcers Mother   . COPD Mother   . Hypothyroidism Mother   . Cancer - Lung Father   . Hypertension Father   . Diabetes Sister        whooping cough  . Diabetes Paternal Aunt   . Diabetes Paternal Uncle   . Cancer Paternal Grandmother        esophageal  . Hypertension Paternal Grandmother   . Hypertension Maternal Grandmother   . Heart disease Maternal Grandmother   . Hypothyroidism Sister   . Depression Other        maternal side  . Depression Other        paternal side    SH:  Widowed, non smoker  Review of Systems  Genitourinary:       Unscheduled bleeding  All other systems reviewed and are negative.   PHYSICAL EXAMINATION:    BP 120/70 (BP Location: Right Arm, Patient Position: Sitting, Cuff Size: Normal)   Pulse 68   Resp 16   Ht _0  (1.727 m)   Wt 136 lb 12.8 oz (62.1 kg)   LMP 10/13/2002   BMI 20.80 kg/m     General appearance: alert, cooperative and appears stated age Flank:  No CVA tenderness Abdomen: soft, non-tender; bowel sounds normal; no masses,  no organomegaly  Pelvic: External genitalia:  no lesions              Urethra:  normal appearing urethra with no masses, tenderness or lesions              Bartholins and Skenes: normal                 Vagina: normal appearing vagina with normal color and discharge, no lesions              Cervix: no lesions              Bimanual Exam:  Uterus:  normal size, contour, position, consistency, mobility, non-tender              Adnexa: no mass, fullness, tenderness              Anus:  no lesions  Chaperone was  present for exam.  Assessment: Gross hematura  Plan: Urine culture and micro pending If these are both negative, will proceed with evaluation by urology.

## 2018-04-20 LAB — URINALYSIS, MICROSCOPIC ONLY
CASTS: NONE SEEN /LPF
Epithelial Cells (non renal): NONE SEEN /hpf (ref 0–10)
RBC, UA: >30 /hpf — AB (ref 0–2)
WBC, UA: >30 /hpf — AB (ref 0–5)

## 2018-04-21 LAB — URINE CULTURE

## 2018-04-22 ENCOUNTER — Other Ambulatory Visit: Payer: Self-pay | Admitting: Obstetrics & Gynecology

## 2018-04-22 ENCOUNTER — Encounter: Payer: Self-pay | Admitting: Obstetrics & Gynecology

## 2018-04-22 ENCOUNTER — Telehealth: Payer: Self-pay | Admitting: Obstetrics & Gynecology

## 2018-04-22 DIAGNOSIS — N3090 Cystitis, unspecified without hematuria: Secondary | ICD-10-CM

## 2018-04-22 DIAGNOSIS — N390 Urinary tract infection, site not specified: Secondary | ICD-10-CM

## 2018-04-22 DIAGNOSIS — R319 Hematuria, unspecified: Principal | ICD-10-CM

## 2018-04-22 MED ORDER — CEPHALEXIN 500 MG PO CAPS: 500.0000 mg | ORAL_CAPSULE | Freq: Three times a day (TID) | ORAL | 0 refills | Status: DC

## 2018-04-22 NOTE — Telephone Encounter (Signed)
1. Patient calling for recent urinalysis results.  2. Patient said she is being referred to a urologist and she'd like to see Dr. Diona Fanti.  Cc: Veronica [name]

## 2018-04-22 NOTE — Telephone Encounter (Signed)
Dr. Sabra Heck -please review and advise on UC results dated 7/8. Ok to proceed with referral to Urology/ Dr. Diona Fanti?

## 2018-04-22 NOTE — Progress Notes (Signed)
Urine culture placed.

## 2018-04-23 MED ORDER — NITROFURANTOIN MONOHYD MACRO 100 MG PO CAPS: 100.0000 mg | ORAL_CAPSULE | Freq: Two times a day (BID) | ORAL | 0 refills | Status: DC

## 2018-04-23 NOTE — Telephone Encounter (Signed)
Call to patient. Advised of instructions from Dr Sabra Heck.  Follow-up urine culture scheduled for 05-03-18. Encounter closed.

## 2018-04-23 NOTE — Telephone Encounter (Signed)
Patient is returning a call from [place] yesterday, see lab results.

## 2018-04-23 NOTE — Telephone Encounter (Signed)
-----   Message from Megan Salon, MD sent at 04/22/2018  6:28 PM EDT ----- Left detailed message on her voice mail per DPR.  Rx for keflex 500mg  tid x 5 days sent to pharmacy on file.  Advised her to call and let me know tomorrow if she has taken this before as she does have a PCN allergy.  Also, if not sure, she was advised not to start it until calling tomorrow.  She does need a repeat urine culture next week after finishing the antibiotics.  Order has been placed.  Please schedule appt.  Referral to urology has been placed.

## 2018-04-23 NOTE — Telephone Encounter (Signed)
Penicillin and cephalosporins are the first line treatment.  The only other alternatives are nitrofurantoin and vancomycin.  I will treat her with 5 days of macrobid 100mg .  Should repeat urine culture at the end of next week.  Ok to send rx to pharmacy.  Thanks.

## 2018-04-23 NOTE — Telephone Encounter (Signed)
Call from patient. Very anxious about positive UTI.  Multiple concerns about strep and new UTI addressed.  Has never taken Keflex.  Has done well with Clindamycin.  Advised will review with Dr Sabra Heck and call her back.

## 2018-05-03 ENCOUNTER — Ambulatory Visit (INDEPENDENT_AMBULATORY_CARE_PROVIDER_SITE_OTHER): Payer: Medicare Other

## 2018-05-03 DIAGNOSIS — N390 Urinary tract infection, site not specified: Secondary | ICD-10-CM | POA: Diagnosis not present

## 2018-05-03 DIAGNOSIS — R319 Hematuria, unspecified: Secondary | ICD-10-CM

## 2018-05-03 NOTE — Progress Notes (Signed)
Patient is here for a repeat urin micro. She states that she finished her Keflex and has not noticed any more blood in her urin.

## 2018-05-04 LAB — URINE CULTURE: Organism ID, Bacteria: NO GROWTH

## 2018-05-11 ENCOUNTER — Telehealth: Payer: Self-pay | Admitting: Obstetrics & Gynecology

## 2018-05-11 NOTE — Telephone Encounter (Signed)
Call placed reference to a referral.

## 2018-06-02 DIAGNOSIS — R310 Gross hematuria: Secondary | ICD-10-CM | POA: Diagnosis not present

## 2018-06-10 DIAGNOSIS — E538 Deficiency of other specified B group vitamins: Secondary | ICD-10-CM | POA: Diagnosis not present

## 2018-06-17 DIAGNOSIS — R3121 Asymptomatic microscopic hematuria: Secondary | ICD-10-CM | POA: Diagnosis not present

## 2018-06-17 DIAGNOSIS — F900 Attention-deficit hyperactivity disorder, predominantly inattentive type: Secondary | ICD-10-CM | POA: Diagnosis not present

## 2018-06-17 DIAGNOSIS — Z6821 Body mass index (BMI) 21.0-21.9, adult: Secondary | ICD-10-CM | POA: Diagnosis not present

## 2018-06-17 DIAGNOSIS — R82998 Other abnormal findings in urine: Secondary | ICD-10-CM | POA: Diagnosis not present

## 2018-06-17 DIAGNOSIS — R194 Change in bowel habit: Secondary | ICD-10-CM | POA: Diagnosis not present

## 2018-06-17 DIAGNOSIS — E038 Other specified hypothyroidism: Secondary | ICD-10-CM | POA: Diagnosis not present

## 2018-06-17 DIAGNOSIS — Z23 Encounter for immunization: Secondary | ICD-10-CM | POA: Diagnosis not present

## 2018-06-24 DIAGNOSIS — B351 Tinea unguium: Secondary | ICD-10-CM | POA: Diagnosis not present

## 2018-06-24 DIAGNOSIS — M79671 Pain in right foot: Secondary | ICD-10-CM | POA: Diagnosis not present

## 2018-06-24 DIAGNOSIS — M79672 Pain in left foot: Secondary | ICD-10-CM | POA: Diagnosis not present

## 2018-06-24 DIAGNOSIS — B353 Tinea pedis: Secondary | ICD-10-CM | POA: Diagnosis not present

## 2018-06-24 DIAGNOSIS — G5761 Lesion of plantar nerve, right lower limb: Secondary | ICD-10-CM | POA: Diagnosis not present

## 2018-06-30 DIAGNOSIS — B351 Tinea unguium: Secondary | ICD-10-CM | POA: Diagnosis not present

## 2018-07-05 ENCOUNTER — Other Ambulatory Visit (INDEPENDENT_AMBULATORY_CARE_PROVIDER_SITE_OTHER): Payer: Self-pay | Admitting: Orthopaedic Surgery

## 2018-07-05 MED ORDER — CELECOXIB 200 MG PO CAPS: 200.0000 mg | ORAL_CAPSULE | Freq: Every day | ORAL | 2 refills | Status: DC

## 2018-08-10 DIAGNOSIS — E538 Deficiency of other specified B group vitamins: Secondary | ICD-10-CM | POA: Diagnosis not present

## 2018-08-20 ENCOUNTER — Ambulatory Visit (INDEPENDENT_AMBULATORY_CARE_PROVIDER_SITE_OTHER): Payer: Medicare Other | Admitting: Orthopaedic Surgery

## 2018-08-20 ENCOUNTER — Encounter (INDEPENDENT_AMBULATORY_CARE_PROVIDER_SITE_OTHER): Payer: Self-pay | Admitting: Orthopaedic Surgery

## 2018-08-20 ENCOUNTER — Ambulatory Visit (INDEPENDENT_AMBULATORY_CARE_PROVIDER_SITE_OTHER): Payer: Medicare Other

## 2018-08-20 VITALS — BP 127/77 | HR 60 | Ht 66.0 in | Wt 130.0 lb

## 2018-08-20 DIAGNOSIS — M25561 Pain in right knee: Secondary | ICD-10-CM | POA: Diagnosis not present

## 2018-08-20 NOTE — Progress Notes (Signed)
Office Visit Note   Patient: Veronica [name]           Date of Birth: 08/12/1951           MRN: [id number] Visit Date: 08/20/2018              Requested by: Crist Infante, MD [address] [place],  [id number] PCP: Crist Infante, MD   Assessment & Plan: Visit Diagnoses:  1. Acute pain of right knee     Plan: Right knee pain appears to be related to recent hiking in [place] where she walked over 187 miles.  Symptoms seem to be consistent more with chondromalacia patella.  Have discussed anti-inflammatory medicines, exercises and even consider cortisone injection.  Presently doing fairly well.  Also has had some numbness in the dorsum of her foot that may be related to her hiking shoes.  Has a history of nondiabetic neuropathy  Follow-Up Instructions: Return if symptoms worsen or fail to improve.   Orders:  Orders Placed This Encounter  Procedures  . XR KNEE 3 VIEW RIGHT   No orders of the defined types were placed in this encounter.     Procedures: No procedures performed   Clinical Data: No additional findings.   Subjective: Chief Complaint  Patient presents with  . New Patient (Initial Visit)    2 KNEE PAIN SINCE 07/10/18 WAS WALKING AND WENT [place], WOKE UP NEXT MORNING UNABLE TO WALK, USED ICED AN DIBUPROFEN FOR 3 DAYS AND THEN ABLE TO WALK A LITTLE HARD TO WALK DOWN STEPS AND HILLS   Dorothy recently returned from [place] where she walked over 187 miles on the [place].  1 particular day where she had been performing a lot of up and down walking she experienced some pain in her right knee.  She was compromise for several days and her ability to to walk.  With anti-inflammatory medicines she seemed to improve and was able to complete the hike.  Since that time she is feeling better.  She is a little concerned about exercising and just needed some direction.  She also is been experiencing some numbness in the dorsum of her right foot.  She thinks it might be  related to her hiking shoes and possibly an exacerbation of her pre-existing nondiabetic neuropathy  HPI  Review of Systems  Constitutional: Negative for fatigue.  HENT: Negative for ear pain.   Eyes: Negative for pain.  Respiratory: Negative for cough and shortness of breath.   Cardiovascular: Positive for leg swelling.  Gastrointestinal: Negative for constipation and diarrhea.  Genitourinary: Negative for difficulty urinating.  Musculoskeletal: Negative for back pain and neck pain.  Skin: Negative for rash.  Allergic/Immunologic: Negative for food allergies.  Neurological: Positive for weakness and numbness.  Psychiatric/Behavioral: Positive for sleep disturbance.     Objective: Vital Signs: BP 127/77 (BP Location: Left Arm, Patient Position: Sitting, Cuff Size: Normal)   Pulse 60   Ht 5\' 6"  (1.676 m)   Wt 130 lb (59 kg)   LMP 10/13/2002   BMI 20.98 kg/m   Physical Exam  Constitutional: She is oriented to person, place, and time. She appears well-developed and well-nourished.  HENT:  Mouth/Throat: Oropharynx is clear and moist.  Eyes: Pupils are equal, round, and reactive to light. EOM are normal.  Pulmonary/Chest: Effort normal.  Neurological: She is alert and oriented to person, place, and time.  Skin: Skin is warm and dry.  Psychiatric: She has a normal mood and affect.  Her behavior is normal.    Ortho Exam awake alert and oriented x3.  Comfortable sitting.  Does not walk with a limp.  No effusion right knee.  Positive patellar crepitation a little bit of discomfort with patellar compression.  No pain along the medial lateral patella.  No medial lateral joint pain.  No instability.  No popliteal discomfort.  No pain over the peroneal nerve.  Motor exam intact to right foot.  A little decreased sensibility in the dorsum of her foot but otherwise has good sensation.  No pain between the toes or evidence of a Morton's neuroma.  Skin  intact  Specialty Comments:  No  specialty comments available.  Imaging: Xr Knee 3 View Right  Result Date: 08/20/2018 Films of the right knee were obtained in several projections standing.  No ectopic calcification.  No evidence of a fracture.  Joint spaces are well-maintained.  A little irregularity along the femoral joint surfaces he is narrowing comparing the medial to the lateral joint.  Patellofemoral joint intact without tilt.  Mild degenerative changes.    PMFS History: Patient Active Problem List   Diagnosis Date Noted  . Abnormal TSH 08/10/2017  . Hypersomnia, persistent 03/24/2013  . UARS (upper airway resistance syndrome) 03/24/2013   Past Medical History:  Diagnosis Date  . Erosive esophagitis    on nexium since, with good GERD relief  . GERD (gastroesophageal reflux disease)   . High cholesterol   . Hypothyroid   . Miscarriage    G3P2  . PE (pulmonary embolism) 1977   secondary falling down stairs with subsequent renal failure  . Shingles 2014  . Vitamin B12 deficiency    due to small intestine resection, malabsorption , on shots  . Vitamin D deficiency     Family History  Problem Relation Age of Onset  . Transient ischemic attack Mother   . Arthritis Mother   . Hypertension Mother   . Ulcers Mother   . COPD Mother   . Hypothyroidism Mother   . Cancer - Lung Father   . Hypertension Father   . Diabetes Sister        whooping cough  . Diabetes Paternal Aunt   . Diabetes Paternal Uncle   . Cancer Paternal Grandmother        esophageal  . Hypertension Paternal Grandmother   . Hypertension Maternal Grandmother   . Heart disease Maternal Grandmother   . Hypothyroidism Sister   . Depression Other        maternal side  . Depression Other        paternal side    Past Surgical History:  Procedure Laterality Date  . APPENDECTOMY  1968  . BOWEL RESECTION  1979   for scar tissue and bowel obstruction.near junction of colon and small bowel per patient  . BUNIONECTOMY  2004   scars on  both feet  . LAPARASCOPY  1978  . TUBAL LIGATION     Social History   Occupational History  . 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 Smoking status: Former Research scientist (life sciences)  . Smokeless tobacco: Never Used  . Tobacco comment: quit in 1978  Substance and Sexual Activity  . Alcohol use: Yes    Alcohol/week: 4.0 standard drinks    Types: 4 Standard drinks or equivalent per week  . Drug use: No  . Sexual activity: Never    Partners: Male    Birth control/protection: Surgical, Post-menopausal    Comment:  BTL

## 2018-09-07 DIAGNOSIS — E538 Deficiency of other specified B group vitamins: Secondary | ICD-10-CM | POA: Diagnosis not present

## 2018-09-26 ENCOUNTER — Other Ambulatory Visit (INDEPENDENT_AMBULATORY_CARE_PROVIDER_SITE_OTHER): Payer: Self-pay | Admitting: Orthopaedic Surgery

## 2018-10-26 ENCOUNTER — Ambulatory Visit (INDEPENDENT_AMBULATORY_CARE_PROVIDER_SITE_OTHER): Payer: Medicare Other | Admitting: Obstetrics & Gynecology

## 2018-10-26 ENCOUNTER — Encounter: Payer: Self-pay | Admitting: Obstetrics & Gynecology

## 2018-10-26 ENCOUNTER — Other Ambulatory Visit (HOSPITAL_COMMUNITY)
Admission: RE | Admit: 2018-10-26 | Discharge: 2018-10-26 | Disposition: A | Discharge status: Home / Self Care | Payer: Medicare Other | Source: Ambulatory Visit | Attending: Obstetrics & Gynecology | Admitting: Obstetrics & Gynecology

## 2018-10-26 ENCOUNTER — Other Ambulatory Visit: Payer: Self-pay

## 2018-10-26 VITALS — BP 110/70 | HR 64 | Resp 16 | Ht 65.25 in | Wt 135.6 lb

## 2018-10-26 DIAGNOSIS — Z124 Encounter for screening for malignant neoplasm of cervix: Secondary | ICD-10-CM | POA: Insufficient documentation

## 2018-10-26 DIAGNOSIS — Z01419 Encounter for gynecological examination (general) (routine) without abnormal findings: Secondary | ICD-10-CM | POA: Diagnosis not present

## 2018-10-26 DIAGNOSIS — R140 Abdominal distension (gaseous): Secondary | ICD-10-CM

## 2018-10-26 DIAGNOSIS — Z0000 Encounter for general adult medical examination without abnormal findings: Secondary | ICD-10-CM

## 2018-10-26 LAB — POCT URINALYSIS DIPSTICK
BILIRUBIN UA: NEGATIVE
GLUCOSE UA: NEGATIVE
KETONES UA: NEGATIVE
Leukocytes, UA: NEGATIVE
Nitrite, UA: NEGATIVE
PH UA: 5 (ref 5.0–8.0)
Protein, UA: NEGATIVE
RBC UA: NEGATIVE
UROBILINOGEN UA: 0.2 U/dL

## 2018-10-26 MED ORDER — NONFORMULARY OR COMPOUNDED ITEM: 4 refills | Status: DC

## 2018-10-26 NOTE — Patient Instructions (Signed)
Mammogram is due.  Please call to schedule.  Please check with Dr. Joylene Draft about your tetanus vaccine.  The last one I have documentation for was in 2009.

## 2018-10-26 NOTE — Progress Notes (Signed)
68 y.o. G2P2 Widowed White or Caucasian female here for annual exam.  Just came from [place], [place].    Had a significant UTI last year with gross hematuria.  She did see Dr. Beatrix Fetters this past year.  Evaluation, per pt, was negative.    She reports having increased abdominal pain over the past several months.  Having increased bloating and acid reflux.  She discussed this Dr. Joylene Draft at her last visit.  She did have a ultrasound.  This showed gallbladder sludge.    Brother diagnosed with high grade colon polyps.  Was advised by [facility] to share this with family.  Will refer back to Dr. Collene Mares.      Dating someone now and having no issues with intercourse. Denies vaginal bleeding.    Having some leaking with coughing or sneezing.  She does have some urinary frequency.  Denies dysuria.  No hematuria.    Patient's last menstrual period was 10/13/2002.          Sexually active: Yes.    The current method of family planning is post menopausal status.    Exercising: Yes.    walking Smoker:  no  Health Maintenance: Pap:  05/23/16 Neg.   02/13/14 Neg History of abnormal Pap:  no MMG:  09/08/17 BIRADS1:Neg  Colonoscopy: 02/11/12 polyps. f/u 5 years.   BMD:   2019 PCP  TDaP:  2009 will check with PCP  Pneumonia vaccine(s):  Done  Shingrix:   Has completed Zostavax Hep C testing: 05/23/16 Neg  Screening Labs: PCP  UA: normal    reports that she has quit smoking. She has never used smokeless tobacco. She reports current alcohol use of about 2.0 standard drinks of alcohol per week. She reports that she does not use drugs.  Past Medical History:  Diagnosis Date  . Erosive esophagitis    on nexium since, with good GERD relief  . GERD (gastroesophageal reflux disease)   . High cholesterol   . Hypothyroid   . Miscarriage    G3P2  . PE (pulmonary embolism) 1977   secondary falling down stairs with subsequent renal failure  . Shingles 2014  . Vitamin B12 deficiency    due to small intestine  resection, malabsorption , on shots  . Vitamin D deficiency     Past Surgical History:  Procedure Laterality Date  . APPENDECTOMY  1968  . BOWEL RESECTION  1979   for scar tissue and bowel obstruction.near junction of colon and small bowel per patient  . BUNIONECTOMY  2004   scars on both feet  . LAPARASCOPY  1978  . TUBAL LIGATION      Current Outpatient Medications  Medication Sig Dispense Refill  . cholecalciferol (VITAMIN D3) 25 MCG (1000 UT) tablet Take 1,000 Units by mouth daily.    . Cyanocobalamin 1000 MCG SUBL Inject as directed. One injection every 3 weeks    . zolpidem (AMBIEN) 10 MG tablet Take 5 mg by mouth at bedtime.  1  . VYVANSE 20 MG capsule Take 20 mg by mouth daily.  0   No current facility-administered medications for this visit.     Family History  Problem Relation Age of Onset  . Transient ischemic attack Mother   . Arthritis Mother   . Hypertension Mother   . Ulcers Mother   . COPD Mother   . Hypothyroidism Mother   . Cancer - Lung Father   . Hypertension Father   . Diabetes Sister  whooping cough  . Diabetes Paternal Aunt   . Diabetes Paternal Uncle   . Cancer Paternal Grandmother        esophageal  . Hypertension Paternal Grandmother   . Hypertension Maternal Grandmother   . Heart disease Maternal Grandmother   . Hypothyroidism Sister   . Depression Other        maternal side  . Depression Other        paternal side    Review of Systems  HENT: Positive for ear pain.   Genitourinary:       Loss of urine spontaneously  Loss of urine with sneeze or cough  Night urination   Skin:       Hair loss   All other systems reviewed and are negative.   Exam:   BP 110/70 (BP Location: Right Arm, Patient Position: Sitting, Cuff Size: Normal)   Pulse 64   Resp 16   Ht 5' 5.25" (1.657 m)   Wt 135 lb 9.6 oz (61.5 kg)   LMP 10/13/2002   BMI 22.39 kg/m    Height: 5' 5.25" (165.7 cm)  Ht Readings from Last 3 Encounters:  10/26/18 5'  5.25" (1.657 m)  08/20/18 5\' 6"  (1.676 m)  05/03/18 5\' 8"  (1.727 m)    General appearance: alert, cooperative and appears stated age Head: Normocephalic, without obvious abnormality, atraumatic Neck: no adenopathy, supple, symmetrical, trachea midline and thyroid normal to inspection and palpation Lungs: clear to auscultation bilaterally Breasts: normal appearance, no masses or tenderness Heart: regular rate and rhythm Abdomen: soft, non-tender; bowel sounds normal; no masses,  no organomegaly Extremities: extremities normal, atraumatic, no cyanosis or edema Skin: Skin color, texture, turgor normal. No rashes or lesions Lymph nodes: Cervical, supraclavicular, and axillary nodes normal. No abnormal inguinal nodes palpated Neurologic: Grossly normal   Pelvic: External genitalia:  no lesions              Urethra:  normal appearing urethra with no masses, tenderness or lesions              Bartholins and Skenes: normal                 Vagina: normal appearing vagina with normal color and discharge, no lesions              Cervix: no lesions              Pap taken: Yes.   Bimanual Exam:  Uterus:  normal size, contour, position, consistency, mobility, non-tender              Adnexa: normal adnexa and no mass, fullness, tenderness               Rectovaginal: Confirms               Anus:  normal sphincter tone, no lesions  Chaperone was present for exam.  A:  Well Woman with normal exam PMP, no HRT H/o HSV 2 H/O small bowel resection with subsequent b 12 deficiency, increased bloating and discomfort this year H/O low Vit D Hypothyroidism  P:   Mammogram guidelines reviewed.  Pt aware due.  States she will schedule pap smear obtained today Vit E vaginal suppositories 200u/ml, one pv two to three times weekly.  rx sent to [facility] will be scheduled to assess ovaries Referral to Dr. Collene Mares will also be made She is going to check with Dr. Joylene Draft about when most recent  Tdap was done  return annually or prn

## 2018-10-28 ENCOUNTER — Ambulatory Visit (INDEPENDENT_AMBULATORY_CARE_PROVIDER_SITE_OTHER): Payer: Medicare Other | Admitting: Obstetrics & Gynecology

## 2018-10-28 ENCOUNTER — Ambulatory Visit (INDEPENDENT_AMBULATORY_CARE_PROVIDER_SITE_OTHER): Payer: Medicare Other

## 2018-10-28 VITALS — BP 110/70 | HR 60 | Resp 14 | Ht 65.25 in | Wt 137.6 lb

## 2018-10-28 DIAGNOSIS — Z83719 Family history of colon polyps, unspecified: Secondary | ICD-10-CM

## 2018-10-28 DIAGNOSIS — R140 Abdominal distension (gaseous): Secondary | ICD-10-CM

## 2018-10-28 DIAGNOSIS — Z8371 Family history of colonic polyps: Secondary | ICD-10-CM | POA: Diagnosis not present

## 2018-10-28 DIAGNOSIS — D251 Intramural leiomyoma of uterus: Secondary | ICD-10-CM

## 2018-10-28 NOTE — Progress Notes (Signed)
67 y.o. G2P2 Widowed White or Caucasian female here for pelvic ultrasound due to increased abdominal pain and bloating that has been occurring over the past few months.  Although her pelvic exam was normal and I do feel this is more GI related, I felt PUS was important to proceed with prior to having her see GI.  Denies vaginal bleeding.  Did have episode of gross hematuria last year that was evaluated by urology.  Evaluation was negative.  Patient's last menstrual period was 10/13/2002.  Contraception: PMP  Findings:  UTERUS: 6 x 3.2 x 2.4cm EMS: 1.96mm ADNEXA: Left ovary:  2.6 x 0.9 x 0.8cm with 74mn intrmural fibroid       Right ovary: 2.2 x 1.0 x 0.7cm, both ovaries are atrophic and without abnormalities CUL DE SAC:  No free fluid  Discussion:  Findings reviewed.  Pictures reviewed.  Feel she needs to see GI at this point.  She is planning on seeing Dr. Collene Mares.  Referral has been placed.  Assessment:  Abdominal bloating and increased abdominal pain over last few months Normal PUS today except for 60mm intramural fibroid  Plan:  Referral to GI has been placed.   Will follow up one year with pt for gyn exam  ~15 minutes spent with patient >50% of time was in face to face discussion of above.

## 2018-10-29 LAB — CYTOLOGY - PAP: Diagnosis: NEGATIVE

## 2018-11-01 ENCOUNTER — Other Ambulatory Visit: Payer: Self-pay | Admitting: Obstetrics & Gynecology

## 2018-11-01 DIAGNOSIS — Z1231 Encounter for screening mammogram for malignant neoplasm of breast: Secondary | ICD-10-CM

## 2018-11-03 ENCOUNTER — Encounter: Payer: Self-pay | Admitting: Obstetrics & Gynecology

## 2018-11-03 DIAGNOSIS — Z8371 Family history of colonic polyps: Secondary | ICD-10-CM | POA: Insufficient documentation

## 2018-11-15 DIAGNOSIS — E038 Other specified hypothyroidism: Secondary | ICD-10-CM | POA: Diagnosis not present

## 2018-11-15 DIAGNOSIS — E538 Deficiency of other specified B group vitamins: Secondary | ICD-10-CM | POA: Diagnosis not present

## 2018-11-15 DIAGNOSIS — R82998 Other abnormal findings in urine: Secondary | ICD-10-CM | POA: Diagnosis not present

## 2018-11-15 DIAGNOSIS — E7849 Other hyperlipidemia: Secondary | ICD-10-CM | POA: Diagnosis not present

## 2018-11-22 DIAGNOSIS — H6121 Impacted cerumen, right ear: Secondary | ICD-10-CM | POA: Diagnosis not present

## 2018-11-22 DIAGNOSIS — R194 Change in bowel habit: Secondary | ICD-10-CM | POA: Diagnosis not present

## 2018-11-22 DIAGNOSIS — Z1331 Encounter for screening for depression: Secondary | ICD-10-CM | POA: Diagnosis not present

## 2018-11-22 DIAGNOSIS — E538 Deficiency of other specified B group vitamins: Secondary | ICD-10-CM | POA: Diagnosis not present

## 2018-11-22 DIAGNOSIS — M2550 Pain in unspecified joint: Secondary | ICD-10-CM | POA: Diagnosis not present

## 2018-11-22 DIAGNOSIS — F900 Attention-deficit hyperactivity disorder, predominantly inattentive type: Secondary | ICD-10-CM | POA: Diagnosis not present

## 2018-11-22 DIAGNOSIS — Z0000 Encounter for general adult medical examination without abnormal findings: Secondary | ICD-10-CM | POA: Diagnosis not present

## 2018-11-22 DIAGNOSIS — Z1339 Encounter for screening examination for other mental health and behavioral disorders: Secondary | ICD-10-CM | POA: Diagnosis not present

## 2018-11-22 DIAGNOSIS — B029 Zoster without complications: Secondary | ICD-10-CM | POA: Diagnosis not present

## 2018-11-22 DIAGNOSIS — H7290 Unspecified perforation of tympanic membrane, unspecified ear: Secondary | ICD-10-CM | POA: Diagnosis not present

## 2018-11-22 DIAGNOSIS — F4320 Adjustment disorder, unspecified: Secondary | ICD-10-CM | POA: Diagnosis not present

## 2018-11-22 DIAGNOSIS — Z6822 Body mass index (BMI) 22.0-22.9, adult: Secondary | ICD-10-CM | POA: Diagnosis not present

## 2018-11-23 ENCOUNTER — Encounter: Payer: Self-pay | Admitting: Neurology

## 2018-11-25 ENCOUNTER — Other Ambulatory Visit: Payer: Self-pay | Admitting: Internal Medicine

## 2018-11-25 DIAGNOSIS — E785 Hyperlipidemia, unspecified: Secondary | ICD-10-CM

## 2018-12-01 ENCOUNTER — Ambulatory Visit
Admission: RE | Admit: 2018-12-01 | Discharge: 2018-12-01 | Disposition: A | Discharge status: Home / Self Care | Payer: Medicare Other | Source: Ambulatory Visit | Attending: Obstetrics & Gynecology | Admitting: Obstetrics & Gynecology

## 2018-12-01 DIAGNOSIS — Z1231 Encounter for screening mammogram for malignant neoplasm of breast: Secondary | ICD-10-CM

## 2018-12-02 ENCOUNTER — Ambulatory Visit
Admission: RE | Admit: 2018-12-02 | Discharge: 2018-12-02 | Disposition: A | Discharge status: Home / Self Care | Payer: No Typology Code available for payment source | Source: Ambulatory Visit | Attending: Internal Medicine | Admitting: Internal Medicine

## 2018-12-02 DIAGNOSIS — E785 Hyperlipidemia, unspecified: Secondary | ICD-10-CM

## 2018-12-07 DIAGNOSIS — E538 Deficiency of other specified B group vitamins: Secondary | ICD-10-CM | POA: Diagnosis not present

## 2018-12-07 DIAGNOSIS — E8801 Alpha-1-antitrypsin deficiency: Secondary | ICD-10-CM | POA: Diagnosis not present

## 2018-12-09 DIAGNOSIS — Z1211 Encounter for screening for malignant neoplasm of colon: Secondary | ICD-10-CM | POA: Diagnosis not present

## 2018-12-09 DIAGNOSIS — K219 Gastro-esophageal reflux disease without esophagitis: Secondary | ICD-10-CM | POA: Diagnosis not present

## 2018-12-09 DIAGNOSIS — R194 Change in bowel habit: Secondary | ICD-10-CM | POA: Diagnosis not present

## 2018-12-09 DIAGNOSIS — Z8371 Family history of colonic polyps: Secondary | ICD-10-CM | POA: Diagnosis not present

## 2018-12-10 DIAGNOSIS — H6121 Impacted cerumen, right ear: Secondary | ICD-10-CM | POA: Insufficient documentation

## 2018-12-10 DIAGNOSIS — H903 Sensorineural hearing loss, bilateral: Secondary | ICD-10-CM | POA: Insufficient documentation

## 2018-12-10 DIAGNOSIS — H6120 Impacted cerumen, unspecified ear: Secondary | ICD-10-CM | POA: Diagnosis not present

## 2019-01-18 ENCOUNTER — Other Ambulatory Visit (INDEPENDENT_AMBULATORY_CARE_PROVIDER_SITE_OTHER): Payer: Self-pay | Admitting: Orthopaedic Surgery

## 2019-01-18 NOTE — Telephone Encounter (Signed)
Ok to renew prescription

## 2019-02-02 ENCOUNTER — Ambulatory Visit: Payer: Medicare Other | Admitting: Neurology

## 2019-02-17 DIAGNOSIS — R5383 Other fatigue: Secondary | ICD-10-CM | POA: Diagnosis not present

## 2019-03-28 ENCOUNTER — Other Ambulatory Visit: Payer: Self-pay

## 2019-03-28 ENCOUNTER — Ambulatory Visit (INDEPENDENT_AMBULATORY_CARE_PROVIDER_SITE_OTHER): Payer: Medicare Other | Admitting: Neurology

## 2019-03-28 ENCOUNTER — Encounter: Payer: Self-pay | Admitting: Neurology

## 2019-03-28 VITALS — BP 108/70 | HR 69 | Ht 65.75 in | Wt 137.0 lb

## 2019-03-28 DIAGNOSIS — R202 Paresthesia of skin: Secondary | ICD-10-CM | POA: Diagnosis not present

## 2019-03-28 NOTE — Patient Instructions (Signed)
NCS/EMG of the legs.  Do no apply lotion on the day of testing  ELECTROMYOGRAM AND NERVE CONDUCTION STUDIES (EMG/NCS) INSTRUCTIONS  How to Prepare The neurologist conducting the EMG will need to know if you have certain medical conditions. Tell the neurologist and other EMG lab personnel if you: . Have a pacemaker or any other electrical medical device . Take blood-thinning medications . Have hemophilia, a blood-clotting disorder that causes prolonged bleeding Bathing Take a shower or bath shortly before your exam in order to remove oils from your skin. Don't apply lotions or creams before the exam.  What to Expect You'll likely be asked to change into a hospital gown for the procedure and lie down on an examination table. The following explanations can help you understand what will happen during the exam.  . Electrodes. The neurologist or a technician places surface electrodes at various locations on your skin depending on where you're experiencing symptoms. Or the neurologist may insert needle electrodes at different sites depending on your symptoms.  . Sensations. The electrodes will at times transmit a tiny electrical current that you may feel as a twinge or spasm. The needle electrode may cause discomfort or pain that usually ends shortly after the needle is removed. If you are concerned about discomfort or pain, you may want to talk to the neurologist about taking a short break during the exam.  . Instructions. During the needle EMG, the neurologist will assess whether there is any spontaneous electrical activity when the muscle is at rest - activity that isn't present in healthy muscle tissue - and the degree of activity when you slightly contract the muscle.  He or she will give you instructions on resting and contracting a muscle at appropriate times. Depending on what muscles and nerves the neurologist is examining, he or she may ask you to change positions during the exam.  After your  EMG You may experience some temporary, minor bruising where the needle electrode was inserted into your muscle. This bruising should fade within several days. If it persists, contact your primary care doctor.

## 2019-03-28 NOTE — Progress Notes (Signed)
[facility] Clinic Note - Initial Visit   Date: 03/28/19  Veronica [name] MRN: [id number] DOB: 06/12/1951   Dear Dr. Joylene Draft:  Thank you for your kind referral of Veronica [name] for consultation of bilateral feet paresthesias. Although her history is well known to you, please allow [place] to reiterate it for the purpose of our medical record. The patient was accompanied to the clinic by self.  History of Present Illness: Veronica [name] is a 68 y.o. right-handed female with vitamin B12 deficiency due to small intestine resection and GERD presenting for evaluation of bilateral feet paresthesias.   Starting around 10 years ago, she began having stinging pain in the balls of the feet and toes, worse on the right.  She is very active and walks regularly, even completing half-marathons in the past.  Initially, she was able to walk up to 8 miles before her feet would start stinging, however, over the past few years, she now experiencing tingling and burning after walking 5 miles. It remains restricted to the distal foot.  She does not have numbness or weakness.  She denies any imbalance.  Symptoms are not present at rest or light exercises.  She has been evaluated at by Dr. Brett Fairy at [facility] in 2013 for the same symptoms.  She had extensive laboratory testing which was normal.    She also reports having two spells of severe hip pain with weight bearing, which lasted a few days. Pain was severe and involved the hip and down in to the thigh.  She has mild chronic low back pain.  She had an accident about 40 years ago and injured her back with bilateral leg weakness.   She also complains of intermittent bilateral hand tingling, often when she wakes up in the morning.  No hand weakness or neck pain.   Out-side paper records, electronic medical record, and images have been reviewed where available and summarized as: Labs 11/15/2018: CMP normal, TSH 3.9   Past  Medical History:  Diagnosis Date  . Erosive esophagitis    on nexium since, with good GERD relief  . GERD (gastroesophageal reflux disease)   . High cholesterol   . Hypothyroid   . Miscarriage    G3P2  . PE (pulmonary embolism) 1977   secondary falling down stairs with subsequent renal failure  . Shingles 2014  . Vitamin B12 deficiency    due to small intestine resection, malabsorption , on shots  . Vitamin D deficiency     Past Surgical History:  Procedure Laterality Date  . APPENDECTOMY  1968  . BOWEL RESECTION  1979   for scar tissue and bowel obstruction.near junction of colon and small bowel per patient  . BUNIONECTOMY  2004   scars on both feet  . LAPARASCOPY  1978  . TUBAL LIGATION       Medications:  Outpatient Encounter Medications as of 03/28/2019  Medication Sig  . cholecalciferol (VITAMIN D3) 25 MCG (1000 UT) tablet Take 1,000 Units by mouth daily.  . Cyanocobalamin 1000 MCG SUBL Inject as directed. One injection every 2 weeks  . VYVANSE 20 MG capsule Take 20 mg by mouth daily.  Marland Kitchen zolpidem (AMBIEN) 10 MG tablet Take 5 mg by mouth at bedtime. Pt breaks 5mg  in half and takes prn  . [DISCONTINUED] celecoxib (CELEBREX) 200 MG capsule TAKE 1 CAPSULE BY MOUTH EVERY DAY  . [DISCONTINUED] NONFORMULARY OR COMPOUNDED ITEM Vit E vaginal suppositories, 200u/ml.  One pv two to  three times weekly.  #24/4RF.   No facility-administered encounter medications on file as of 03/28/2019.     Allergies:  Allergies  Allergen Reactions  . Codeine   . Demerol [Meperidine]   . Penicillins     Family History: Family History  Problem Relation Age of Onset  . Transient ischemic attack Mother   . Arthritis Mother   . Hypertension Mother   . Ulcers Mother   . COPD Mother   . Hypothyroidism Mother   . Cancer - Lung Father   . Hypertension Father   . Diabetes Sister        whooping cough  . Diabetes Paternal Aunt   . Diabetes Paternal Uncle   . Cancer Paternal Grandmother         esophageal  . Hypertension Paternal Grandmother   . Hypertension Maternal Grandmother   . Heart disease Maternal Grandmother   . Hypothyroidism Sister   . Depression Other        maternal side  . Depression Other        paternal side    Social History: Social History   Tobacco Use  . Smoking status: Former Research scientist (life sciences)  . Smokeless tobacco: Never Used  . Tobacco comment: quit in 1978  Substance Use Topics  . Alcohol use: Yes    Alcohol/week: 2.0 standard drinks    Types: 2 Standard drinks or equivalent per week  . Drug use: No   Social History   Social History Narrative   Right handed      Lives in two story home. Lives alone. Widowed.      Retired, Holiday representative.        Review of Systems:  CONSTITUTIONAL: No fevers, chills, night sweats, or weight loss.   EYES: No visual changes or eye pain ENT: No hearing changes.  No history of nose bleeds.   RESPIRATORY: No cough, wheezing and shortness of breath.   CARDIOVASCULAR: Negative for chest pain, and palpitations.   GI: Negative for abdominal discomfort, blood in stools or black stools.  No recent change in bowel habits.   GU:  No history of incontinence.   MUSCLOSKELETAL: No history of joint pain or swelling.  No myalgias.   SKIN: Negative for lesions, rash, and itching.   HEMATOLOGY/ONCOLOGY: Negative for prolonged bleeding, bruising easily, and swollen nodes.  No history of cancer.   ENDOCRINE: Negative for cold or heat intolerance, polydipsia or goiter.   PSYCH:  No depression or anxiety symptoms.   NEURO: As Above.   Vital Signs:  BP 108/70   Pulse 69   Ht 5' 5.75" (1.67 m)   Wt 137 lb (62.1 kg)   LMP 10/13/2002   SpO2 99%   BMI 22.28 kg/m    General Medical Exam:   General:  Well appearing, comfortable.   Eyes/ENT: see cranial nerve examination.   Neck:   No carotid bruits. Respiratory:  Clear to auscultation, good air entry bilaterally.   Cardiac:  Regular rate and rhythm, no murmur.    Extremities:  No deformities, edema, or skin discoloration.  Skin:  No rashes or lesions.  Neurological Exam: MENTAL STATUS including orientation to time, place, person, recent and remote memory, attention span and concentration, language, and fund of knowledge is normal.  Speech is not dysarthric.  CRANIAL NERVES: II:  No visual field defects.  Unremarkable fundi.   III-IV-VI: Pupils equal round and reactive to light.  Normal conjugate, extra-ocular eye movements in all directions of gaze.  No nystagmus.  No ptosis.   V:  Normal facial sensation.    VII:  Normal facial symmetry and movements.   VIII:  Normal hearing and vestibular function.   IX-X:  Normal palatal movement.   XI:  Normal shoulder shrug and head rotation.   XII:  Normal tongue strength and range of motion, no deviation or fasciculation.  MOTOR:  No atrophy, fasciculations or abnormal movements.  No pronator drift.   Upper Extremity:  Right  Left  Deltoid  5/5   5/5   Biceps  5/5   5/5   Triceps  5/5   5/5   Infraspinatus 5/5  5/5  Medial pectoralis 5/5  5/5  Wrist extensors  5/5   5/5   Wrist flexors  5/5   5/5   Finger extensors  5/5   5/5   Finger flexors  5/5   5/5   Dorsal interossei  5/5   5/5   Abductor pollicis  5/5   5/5   Tone (Ashworth scale)  0  0   Lower Extremity:  Right  Left  Hip flexors  5/5   5/5   Hip extensors  5/5   5/5   Adductor 5/5  5/5  Abductor 5/5  5/5  Knee flexors  5/5   5/5   Knee extensors  5/5   5/5   Dorsiflexors  5/5   5/5   Plantarflexors  5/5   5/5   Toe extensors  5/5   5/5   Toe flexors  5/5   5/5   Tone (Ashworth scale)  0  0   MSRs:  Right        Left                  brachioradialis 2+  2+  biceps 2+  2+  triceps 2+  2+  patellar 2+  2+  ankle jerk 2+  2+  Hoffman no  no  plantar response down  down   SENSORY:  Normal and symmetric perception of light touch, pinprick, vibration, and proprioception.  Romberg's sign absent.  Negative Tinel's sign at the  wrist bilaterally.  COORDINATION/GAIT: Normal finger-to- nose-finger and heel-to-shin.  Intact rapid alternating movements bilaterally.  Able to rise from a chair without using arms.  Gait narrow based and stable. Tandem and stressed gait intact.    IMPRESSION: 1.  Bilateral feet paresthesias.  Intermittent symptoms with normal neurological exam is reassuring.  No history of diabetes or alcohol abuse or family history of neuropathy.  She has been on chronic vitamin B12 supplementation and her recent levels were normal, which makes neuropathy due to B12 deficiency low.  Although small fiber neuropathy is considered, it is unusual for symptoms to manifest after prolonged exertion as neuropathy tends to be more constant. Plan to obtain NCS/EMG of the legs to screen for neuropathy and lumbosacral radiculopathy.  2.  Episodic bilateral hand paresthesias may be early signs of carpal tunnel syndrome, especially as it tends to wake her up from sleeping.  Strategies to minimize nerve compression at the wrist was discussed.  If this gets worse, electrodiagnostic testing of the hands can be ordered going forward.  Further recommendations pending results.   Thank you for allowing me to participate in patient's care.  If I can answer any additional questions, I would be pleased to do so.    Sincerely,     K. Posey Pronto, DO

## 2019-04-01 DIAGNOSIS — K449 Diaphragmatic hernia without obstruction or gangrene: Secondary | ICD-10-CM | POA: Diagnosis not present

## 2019-04-01 DIAGNOSIS — K219 Gastro-esophageal reflux disease without esophagitis: Secondary | ICD-10-CM | POA: Diagnosis not present

## 2019-04-01 DIAGNOSIS — R194 Change in bowel habit: Secondary | ICD-10-CM | POA: Diagnosis not present

## 2019-04-01 DIAGNOSIS — Z1211 Encounter for screening for malignant neoplasm of colon: Secondary | ICD-10-CM | POA: Diagnosis not present

## 2019-04-26 ENCOUNTER — Other Ambulatory Visit: Payer: Self-pay

## 2019-04-26 ENCOUNTER — Ambulatory Visit (INDEPENDENT_AMBULATORY_CARE_PROVIDER_SITE_OTHER): Payer: Medicare Other | Admitting: Neurology

## 2019-04-26 DIAGNOSIS — R202 Paresthesia of skin: Secondary | ICD-10-CM | POA: Diagnosis not present

## 2019-04-26 NOTE — Procedures (Signed)
[facility]  [address], [place]  [place], [place] [address] Tel: [phone] Fax:  [phone] Test Date:  04/26/2019  Patient: Veronica [name] DOB: 1951/09/01 Physician: Narda Amber, DO  Sex: Female Height: 5\' 6"  Ref Phys: Narda Amber, DO  ID#: [id number] Temp: 34.0C Technician:    Patient Complaints: This is a 68 year old female referred for evaluation of intermittent bilateral feet paresthesias.  NCV & EMG Findings: Electrodiagnostic testing of the right lower extremity and additional studies of the left shows: 1. Bilateral sural and superficial peroneal sensory responses are within normal limits. 2. Bilateral peroneal and tibial motor responses are within normal limits. 3. Bilateral tibial H reflex studies are within normal limits. 4. There is no evidence of active or chronic motor axonal changes affecting any of the tested muscles.  Motor unit configuration and recruitment pattern is within normal limits.  Impression: This is a normal study of the lower extremities.  In particular, there is no evidence of a sensorimotor polyneuropathy or lumbosacral radiculopathy.     ___________________________ Narda Amber, DO    Nerve Conduction Studies Anti Sensory Summary Table   Site NR Peak (ms) Norm Peak (ms) P-T Amp (V) Norm P-T Amp  Left Sup Peroneal Anti Sensory (Ant Lat Mall)  34C  12 cm    1.9 <4.6 14.8 >3  Right Sup Peroneal Anti Sensory (Ant Lat Mall)  34C  12 cm    1.9 <4.6 16.5 >3  Left Sural Anti Sensory (Lat Mall)  34C  Calf    3.3 <4.6 13.1 >3  Right Sural Anti Sensory (Lat Mall)  34C  Calf    2.8 <4.6 17.3 >3   Motor Summary Table   Site NR Onset (ms) Norm Onset (ms) O-P Amp (mV) Norm O-P Amp Site1 Site2 Delta-0 (ms) Dist (cm) Vel (m/s) Norm Vel (m/s)  Left Peroneal Motor (Ext Dig Brev)  34C  Ankle    3.5 <6.0 4.7 >2.5 B Fib Ankle 6.7 36.0 54 >40  B Fib    10.2  3.6  Poplt B Fib 1.7 8.0 47 >40  Poplt    11.9  3.5         Right  Peroneal Motor (Ext Dig Brev)  34C  Ankle    3.2 <6.0 4.3 >2.5 B Fib Ankle 6.9 36.0 52 >40  B Fib    10.1  4.1  Poplt B Fib 1.6 8.0 50 >40  Poplt    11.7  4.1         Left Tibial Motor (Abd Hall Brev)  34C  Ankle    3.7 <6.0 10.1 >4 Knee Ankle 7.8 41.0 53 >40  Knee    11.5  6.6         Right Tibial Motor (Abd Hall Brev)  34C  Ankle    2.7 <6.0 11.6 >4 Knee Ankle 9.3 41.0 44 >40  Knee    12.0  7.5          H Reflex Studies   NR H-Lat (ms) Lat Norm (ms) L-R H-Lat (ms)  Left Tibial (Gastroc)  34C     33.33 <35 0.28  Right Tibial (Gastroc)  34C     33.61 <35 0.28   EMG   Side Muscle Ins Act Fibs Psw Fasc Number Recrt Dur Dur. Amp Amp. Poly Poly. Comment  Right AntTibialis Nml Nml Nml Nml Nml Nml Nml Nml Nml Nml Nml Nml N/A  Right Gastroc Nml Nml Nml Nml Nml Nml Nml Nml  Nml Nml Nml Nml N/A  Right Flex Dig Long Nml Nml Nml Nml Nml Nml Nml Nml Nml Nml Nml Nml N/A  Right RectFemoris Nml Nml Nml Nml Nml Nml Nml Nml Nml Nml Nml Nml N/A  Right GluteusMed Nml Nml Nml Nml Nml Nml Nml Nml Nml Nml Nml Nml N/A  Left AntTibialis Nml Nml Nml Nml Nml Nml Nml Nml Nml Nml Nml Nml N/A  Left Gastroc Nml Nml Nml Nml Nml Nml Nml Nml Nml Nml Nml Nml N/A  Left Flex Dig Long Nml Nml Nml Nml Nml Nml Nml Nml Nml Nml Nml Nml N/A  Left RectFemoris Nml Nml Nml Nml Nml Nml Nml Nml Nml Nml Nml Nml N/A  Left GluteusMed Nml Nml Nml Nml Nml Nml Nml Nml Nml Nml Nml Nml N/A      Waveforms:

## 2019-04-26 NOTE — Progress Notes (Signed)
Follow-up Visit   Date: 04/26/19   Veronica [name] MRN: [id number] DOB: 06-28-1951   Interim History: Veronica [name] is a 68 y.o. right-handed Caucasian female with vitamin B12 deficiency returning to the clinic for follow-up of bilateral feet paresthesias.  The patient was accompanied to the clinic by self.  History of present illness: Starting around 10 years ago, she began having stinging pain in the balls of the feet and toes, worse on the right.  She is very active and walks regularly, even completing half-marathons in the past.  Initially, she was able to walk up to 8 miles before her feet would start stinging, however, over the past few years, she now experiencing tingling and burning after walking 5 miles. It remains restricted to the distal foot.  She does not have numbness or weakness.  She denies any imbalance.  Symptoms are not present at rest or light exercises.  She has been evaluated at by Dr. Brett Fairy at [facility] in 2013 for the same symptoms.  She had extensive laboratory testing which was normal.    UPDATE 04/26/2019:  There is no evidence of she is here for electrodiagnostic testing of the legs and follow-up.  She continues to be very active walking 8 miles daily and has stinging pain towards the end of her walk.  She denies any achy or throbbing pain of the legs.  There is no numbness.  Medications:  Current Outpatient Medications on File Prior to Visit  Medication Sig Dispense Refill  . cholecalciferol (VITAMIN D3) 25 MCG (1000 UT) tablet Take 1,000 Units by mouth daily.    . Cyanocobalamin 1000 MCG SUBL Inject as directed. One injection every 2 weeks    . VYVANSE 20 MG capsule Take 20 mg by mouth daily.  0  . zolpidem (AMBIEN) 10 MG tablet Take 5 mg by mouth at bedtime. Pt breaks 5mg  in half and takes prn  1   No current facility-administered medications on file prior to visit.     Allergies:  Allergies  Allergen Reactions  . Codeine   . Demerol  [Meperidine]   . Penicillins     Review of Systems:  CONSTITUTIONAL: No fevers, chills, night sweats, or weight loss.  EYES: No visual changes or eye pain ENT: No hearing changes.  No history of nose bleeds.   RESPIRATORY: No cough, wheezing and shortness of breath.   CARDIOVASCULAR: Negative for chest pain, and palpitations.   GI: Negative for abdominal discomfort, blood in stools or black stools.  No recent change in bowel habits.   GU:  No history of incontinence.   MUSCLOSKELETAL: No history of joint pain or swelling.  No myalgias.   SKIN: Negative for lesions, rash, and itching.   ENDOCRINE: Negative for cold or heat intolerance, polydipsia or goiter.   PSYCH:  No depression or anxiety symptoms.   NEURO: As Above.    General Medical Exam:   General:  Well appearing, comfortable   Neurological Exam: MENTAL STATUS including orientation to time, place, person, recent and remote memory, attention span and concentration, language, and fund of knowledge is normal.  Speech is not dysarthric.  CRANIAL NERVES:   Face is symmetric.   MOTOR:  Motor strength is 5/5 in all extremities, including distally.  No atrophy, fasciculations or abnormal movements.  No pronator drift.  Tone is normal.    MSRs:  Reflexes are 2+/4 throughout.  SENSORY:  Intact to vibration throughout.  COORDINATION/GAIT:  Normal finger-to- nose-finger.  Intact rapid alternating movements bilaterally.  Gait narrow based and stable.   Data: NCS/EMG of the legs:  Normal.    IMPRESSION/PLAN: Intermittent, exertional bilateral feet stinging pain.  Patient was reassured that there is no evidence of peripheral neuropathy on her exam or on electrodiagnostic testing.Unlikely to be vascular claudication given that she has to walk 8 miles before symptom onset. Symptoms may be due to musculoskeletal pathology, such as tendinitis, due to overuse.  She will talk to her primary care doctor, if her symptoms continue to bother  her.  Return to clinic as needed    Thank you for allowing me to participate in patient's care.  If I can answer any additional questions, I would be pleased to do so.    Sincerely,    Altonio Schwertner K. Posey Pronto, DO

## 2019-04-27 DIAGNOSIS — L821 Other seborrheic keratosis: Secondary | ICD-10-CM | POA: Diagnosis not present

## 2019-04-27 DIAGNOSIS — L814 Other melanin hyperpigmentation: Secondary | ICD-10-CM | POA: Diagnosis not present

## 2019-04-27 DIAGNOSIS — B351 Tinea unguium: Secondary | ICD-10-CM | POA: Diagnosis not present

## 2019-04-27 DIAGNOSIS — L608 Other nail disorders: Secondary | ICD-10-CM | POA: Diagnosis not present

## 2019-04-27 DIAGNOSIS — D1801 Hemangioma of skin and subcutaneous tissue: Secondary | ICD-10-CM | POA: Diagnosis not present

## 2019-04-27 DIAGNOSIS — D225 Melanocytic nevi of trunk: Secondary | ICD-10-CM | POA: Diagnosis not present

## 2019-07-20 DIAGNOSIS — G629 Polyneuropathy, unspecified: Secondary | ICD-10-CM | POA: Diagnosis not present

## 2019-07-20 DIAGNOSIS — E7849 Other hyperlipidemia: Secondary | ICD-10-CM | POA: Diagnosis not present

## 2019-07-20 DIAGNOSIS — E559 Vitamin D deficiency, unspecified: Secondary | ICD-10-CM | POA: Diagnosis not present

## 2019-07-20 DIAGNOSIS — E785 Hyperlipidemia, unspecified: Secondary | ICD-10-CM | POA: Diagnosis not present

## 2019-07-20 DIAGNOSIS — E538 Deficiency of other specified B group vitamins: Secondary | ICD-10-CM | POA: Diagnosis not present

## 2019-08-01 ENCOUNTER — Other Ambulatory Visit: Payer: Self-pay

## 2019-08-01 DIAGNOSIS — Z20822 Contact with and (suspected) exposure to covid-19: Secondary | ICD-10-CM

## 2019-08-03 LAB — NOVEL CORONAVIRUS, NAA: SARS-CoV-2, NAA: NOT DETECTED

## 2019-09-01 DIAGNOSIS — Z23 Encounter for immunization: Secondary | ICD-10-CM | POA: Diagnosis not present

## 2019-09-13 ENCOUNTER — Other Ambulatory Visit: Payer: Self-pay

## 2019-09-13 DIAGNOSIS — Z20822 Contact with and (suspected) exposure to covid-19: Secondary | ICD-10-CM

## 2019-09-13 DIAGNOSIS — Z20828 Contact with and (suspected) exposure to other viral communicable diseases: Secondary | ICD-10-CM | POA: Diagnosis not present

## 2019-09-15 LAB — NOVEL CORONAVIRUS, NAA: SARS-CoV-2, NAA: NOT DETECTED

## 2019-11-02 ENCOUNTER — Ambulatory Visit: Payer: Medicare Other | Attending: Internal Medicine

## 2019-11-02 DIAGNOSIS — Z23 Encounter for immunization: Secondary | ICD-10-CM | POA: Diagnosis not present

## 2019-11-02 NOTE — Progress Notes (Signed)
   Covid-19 Vaccination Clinic  Name:  [name]    MRN: [id number] DOB: 01-12-1951  11/02/2019  Ms. [name] was observed post Covid-19 immunization for 15 minutes without incidence. She was provided with Vaccine Information Sheet and instruction to access the V-Safe system.   Ms. [name] was instructed to call 911 with any severe reactions post vaccine: Marland Kitchen Difficulty breathing  . Swelling of your face and throat  . A fast heartbeat  . A bad rash all over your body  . Dizziness and weakness    Immunizations Administered    Name Date Dose VIS Date Route   Pfizer COVID-19 Vaccine 11/02/2019  8:22 AM 0.3 mL 09/23/2019 Intramuscular   Manufacturer: [employer], [employer]   Lot: [id number]   [place]: [id number]

## 2019-11-06 DIAGNOSIS — Z20828 Contact with and (suspected) exposure to other viral communicable diseases: Secondary | ICD-10-CM | POA: Diagnosis not present

## 2019-11-11 ENCOUNTER — Other Ambulatory Visit: Payer: Self-pay

## 2019-11-14 ENCOUNTER — Ambulatory Visit: Payer: Medicare Other

## 2019-11-15 ENCOUNTER — Ambulatory Visit (INDEPENDENT_AMBULATORY_CARE_PROVIDER_SITE_OTHER): Payer: Medicare Other | Admitting: Obstetrics & Gynecology

## 2019-11-15 ENCOUNTER — Other Ambulatory Visit: Payer: Self-pay

## 2019-11-15 ENCOUNTER — Encounter: Payer: Self-pay | Admitting: Obstetrics & Gynecology

## 2019-11-15 VITALS — BP 128/66 | HR 64 | Temp 97.3°F | Resp 10 | Ht 65.0 in | Wt 134.0 lb

## 2019-11-15 DIAGNOSIS — R413 Other amnesia: Secondary | ICD-10-CM

## 2019-11-15 DIAGNOSIS — J438 Other emphysema: Secondary | ICD-10-CM

## 2019-11-15 DIAGNOSIS — Z01419 Encounter for gynecological examination (general) (routine) without abnormal findings: Secondary | ICD-10-CM

## 2019-11-15 DIAGNOSIS — Z124 Encounter for screening for malignant neoplasm of cervix: Secondary | ICD-10-CM

## 2019-11-15 DIAGNOSIS — L608 Other nail disorders: Secondary | ICD-10-CM | POA: Diagnosis not present

## 2019-11-15 DIAGNOSIS — E538 Deficiency of other specified B group vitamins: Secondary | ICD-10-CM | POA: Diagnosis not present

## 2019-11-15 NOTE — Progress Notes (Signed)
69 y.o. G2P2 Widowed White or Caucasian female here for annual exam.  Had a chest x ray that showed emphysema.  Had pulmonary function tests that were normal.  Also had a coronary CT done this year.    Patient states that her step-daughter passed of a massive heart attack 10/21/19.  This has been very stressful for her and her family.     Worried about her memory.  Mother had demetia that was related to vascular changes.  Just feeling forgetful.  This started before daughter in [place] death.    H/o lichen planus pilaris of scalp.  Massages scalp and feels her scalp feels different.  Feels like skull is different.  Doesn't feel like a skin issue.  Feels nails are different as well.  Having some tingling along her right skin in same location where she had previous shingles.  This is better but now a little tender to the touch.  She never had any rashes.  Lastly, she is having some urinary leakage issues that are moderately worse this year.  Patient's last menstrual period was 10/13/2002.          Sexually active: Yes.    The current method of family planning is post menopausal status.    Exercising: Yes.    walking Smoker:  no  Health Maintenance: Pap:   10/26/18 Neg  05/23/16 Neg.              02/13/14 Neg History of abnormal Pap:  no MMG:  12/02/18 BIRADS 1 negative/density c Colonoscopy:  04/01/19 f/u 10 years BMD:   2019 PCP  TDaP:  Patient will check with PCP Pneumonia vaccine(s):  completed Shingrix:   Has completed Zostavax Hep C testing: 05/23/16 Neg Screening Labs: PCP   reports that she has quit smoking. She has never used smokeless tobacco. She reports current alcohol use of about 2.0 standard drinks of alcohol per week. She reports that she does not use drugs.  Past Medical History:  Diagnosis Date  . Erosive esophagitis    on nexium since, with good GERD relief  . GERD (gastroesophageal reflux disease)   . High cholesterol   . Hypothyroid   . Miscarriage    G3P2  . PE  (pulmonary embolism) 1977   secondary falling down stairs with subsequent renal failure  . Shingles 2014  . Vitamin B12 deficiency    due to small intestine resection, malabsorption , on shots  . Vitamin D deficiency     Past Surgical History:  Procedure Laterality Date  . APPENDECTOMY  1968  . BOWEL RESECTION  1979   for scar tissue and bowel obstruction.near junction of colon and small bowel per patient  . BUNIONECTOMY  2004   scars on both feet  . LAPARASCOPY  1978  . TUBAL LIGATION      Current Outpatient Medications  Medication Sig Dispense Refill  . cholecalciferol (VITAMIN D3) 25 MCG (1000 UT) tablet Take 1,000 Units by mouth daily.    . Cyanocobalamin 1000 MCG SUBL Inject as directed. One injection every 2 weeks    . VYVANSE 20 MG capsule Take 20 mg by mouth daily.  0  . zolpidem (AMBIEN) 10 MG tablet Take 5 mg by mouth at bedtime. Pt breaks 5mg  in half and takes prn  1   No current facility-administered medications for this visit.    Family History  Problem Relation Age of Onset  . Transient ischemic attack Mother   . Arthritis Mother   .  Hypertension Mother   . Ulcers Mother   . COPD Mother   . Hypothyroidism Mother   . Cancer - Lung Father   . Hypertension Father   . Diabetes Sister        whooping cough  . Diabetes Paternal Aunt   . Diabetes Paternal Uncle   . Cancer Paternal Grandmother        esophageal  . Hypertension Paternal Grandmother   . Hypertension Maternal Grandmother   . Heart disease Maternal Grandmother   . Hypothyroidism Sister   . Depression Other        maternal side  . Depression Other        paternal side    Review of Systems  All other systems reviewed and are negative.   Exam:   BP 128/66 (BP Location: Right Arm, Patient Position: Sitting, Cuff Size: Normal)   Pulse 64   Temp (!) 97.3 F (36.3 C) (Temporal)   Resp 10   Ht 5\' 5"  (1.651 m)   Wt 134 lb (60.8 kg)   LMP 10/13/2002   BMI 22.30 kg/m     Height: 5\' 5"   (165.1 cm)  Ht Readings from Last 3 Encounters:  11/15/19 5\' 5"  (1.651 m)  03/28/19 5' 5.75" (1.67 m)  10/28/18 5' 5.25" (1.657 m)    General appearance: alert, cooperative and appears stated age Head: Normocephalic, without obvious abnormality, atraumatic Neck: no adenopathy, supple, symmetrical, trachea midline and thyroid normal to inspection and palpation Lungs: clear to auscultation bilaterally Breasts: normal appearance, no masses or tenderness Heart: regular rate and rhythm Abdomen: soft, non-tender; bowel sounds normal; no masses,  no organomegaly Extremities: extremities normal, atraumatic, no cyanosis or edema Skin: Skin color, texture, turgor normal. No rashes or lesions Lymph nodes: Cervical, supraclavicular, and axillary nodes normal. No abnormal inguinal nodes palpated Neurologic: Grossly normal   Pelvic: External genitalia:  no lesions              Urethra:  normal appearing urethra with no masses, tenderness or lesions              Bartholins and Skenes: normal                 Vagina: normal appearing vagina with normal color and discharge, no lesions              Cervix: no lesions              Pap taken: No. Bimanual Exam:  Uterus:  normal size, contour, position, consistency, mobility, non-tender              Adnexa: normal adnexa and no mass, fullness, tenderness               Rectovaginal: Confirms               Anus:  normal sphincter tone, no lesions  Chaperone, Terence Lux, CMA, was present for exam.  A:  Well Woman with normal exam PMP, no HRT Insomnia Concerns about memory Hypothyroidism H/o small bowel resection and now B12 deficiency H/O PE after falling down stairs 1977.  Coagulopathy testing negative. Vit D deficiency Mild emphysema on coronary CT and CXR Skull ridges (pt aware I do not know what to do about this and may want to ask Dr. Joylene Draft for his opinion)   P:   Mammogram guidelines reviewed.  Doing yearly. pap smear not indicated  this year.  Neg 2020 CMP, B12 and TSH levels obtained today  Pulmonology referral placed today to establish care and plan for her emphysema findings on CT and CXR Referral to neurology for memory concerns Release of records from Dr. Joylene Draft signed today return annually or prn  In addition to routine gyn exam, additional time spent discussing pt's several concerns that were not gyn related including emphysema, memory concerns, skull/scalp changes.  At least 15 minutes spent in face to face discussion of these issues/concerns today.

## 2019-11-16 LAB — VITAMIN B12: Vitamin B-12: 334 pg/mL (ref 232–1245)

## 2019-11-16 LAB — TSH: TSH: 3.63 u[IU]/mL (ref 0.450–4.500)

## 2019-11-18 LAB — COMPREHENSIVE METABOLIC PANEL
ALT: 7 IU/L (ref 0–32)
AST: 15 IU/L (ref 0–40)
Albumin/Globulin Ratio: 2.2 (ref 1.2–2.2)
Albumin: 4.4 g/dL (ref 3.8–4.8)
Alkaline Phosphatase: 95 IU/L (ref 39–117)
BUN/Creatinine Ratio: 20 (ref 12–28)
BUN: 17 mg/dL (ref 8–27)
Bilirubin Total: 0.3 mg/dL (ref 0.0–1.2)
CO2: 20 mmol/L (ref 20–29)
Calcium: 9.4 mg/dL (ref 8.7–10.3)
Chloride: 104 mmol/L (ref 96–106)
Creatinine, Ser: 0.86 mg/dL (ref 0.57–1.00)
GFR calc Af Amer: 80 mL/min/{1.73_m2} (ref >59)
GFR calc non Af Amer: 70 mL/min/{1.73_m2} (ref >59)
Globulin, Total: 2 g/dL (ref 1.5–4.5)
Glucose: 90 mg/dL (ref 65–99)
Potassium: 4.6 mmol/L (ref 3.5–5.2)
Sodium: 142 mmol/L (ref 134–144)
Total Protein: 6.4 g/dL (ref 6.0–8.5)

## 2019-11-18 LAB — SPECIMEN STATUS REPORT

## 2019-11-20 ENCOUNTER — Ambulatory Visit: Payer: Medicare Other | Attending: Internal Medicine

## 2019-11-20 DIAGNOSIS — Z23 Encounter for immunization: Secondary | ICD-10-CM | POA: Insufficient documentation

## 2019-11-20 NOTE — Progress Notes (Signed)
   Covid-19 Vaccination Clinic  Name:  Veronica [name]    MRN: [id number] DOB: Dec 15, 1950  11/20/2019  [name] was observed post Covid-19 immunization for 15 minutes without incidence. She was provided with Vaccine Information Sheet and instruction to access the V-Safe system.   [name] was instructed to call 911 with any severe reactions post vaccine: Marland Kitchen Difficulty breathing  . Swelling of your face and throat  . A fast heartbeat  . A bad rash all over your body  . Dizziness and weakness    Immunizations Administered    Name Date Dose VIS Date Route   Pfizer COVID-19 Vaccine 11/20/2019 12:15 PM 0.3 mL 09/23/2019 Intramuscular   Manufacturer: [place]   Lot: [id number]   [place]: [id number]

## 2019-11-29 ENCOUNTER — Encounter: Payer: Self-pay | Admitting: Obstetrics & Gynecology

## 2019-12-29 ENCOUNTER — Encounter: Payer: Self-pay | Admitting: Neurology

## 2019-12-29 ENCOUNTER — Other Ambulatory Visit: Payer: Self-pay

## 2019-12-29 ENCOUNTER — Ambulatory Visit (INDEPENDENT_AMBULATORY_CARE_PROVIDER_SITE_OTHER): Payer: Medicare Other | Admitting: Neurology

## 2019-12-29 VITALS — BP 109/66 | HR 68 | Temp 97.9°F | Ht 65.5 in | Wt 134.0 lb

## 2019-12-29 DIAGNOSIS — Z862 Personal history of diseases of the blood and blood-forming organs and certain disorders involving the immune mechanism: Secondary | ICD-10-CM

## 2019-12-29 DIAGNOSIS — R202 Paresthesia of skin: Secondary | ICD-10-CM | POA: Diagnosis not present

## 2019-12-29 DIAGNOSIS — M899 Disorder of bone, unspecified: Secondary | ICD-10-CM

## 2019-12-29 DIAGNOSIS — E538 Deficiency of other specified B group vitamins: Secondary | ICD-10-CM | POA: Diagnosis not present

## 2019-12-29 NOTE — Progress Notes (Signed)
SLEEP MEDICINE CLINIC    Provider:  Larey Seat, MD  Primary Care Physician:  Veronica Infante, MD [place] [address]     Referring Provider:  Nino Glow, MD Gynaecology.     Veronica [name], [address] [place],  [place] [address]          Chief Complaint according to patient   Patient presents with:    . New Patient (Initial Visit)     5-6 mths ago, noticed a indentation on scalp and she felt it came more defined. she has been having some difficulties with headaches and vision concerns. she has noticed some memory concerns as well.  She has lichen planus and she has seen Dr. Posey Pronto just last summer for sensory abnormality- she has EMG and NCV at [facility].  History of B 12 deficiency - treated with Injection.      HISTORY OF PRESENT ILLNESS:  Veronica [name] is a 69 year old  Caucasian female patient seen here upon  a referral on 12/29/2019 from her gynecologist.  Chief concern according to patient : " I have this new indention in my scalp".    I have the pleasure of seeing Veronica [name]  who has a  has a past medical history of Erosive esophagitis, GERD (gastroesophageal reflux disease), High cholesterol, Hypothyroid, Miscarriage, PE (pulmonary embolism) (1977), Shingles (2014), Vitamin B12 deficiency, and Vitamin D deficiency.  She has chronic absorption difficulties with vitamin B12 and now during the Covid pandemic change to giving herself intramuscular injections of cyanocobalamin.  Her level remains low normal.  Back to the main concern addressed today.  The patient reports that she has noted a scalp lesion and indentation in the scalp that she had not noticed before 5 or 6 months ago.  She frequently massages her scalp, as  a sideeffect of the lichen condition that is underlying.  However she is sure that this was a new finding.  She points to the frontal midline suture and allowed me to palpate her scalp, which indeed has an indentation the  size of a quarter-on each side of the elevated ridge.     She is also concerned about possible memory changes. She needs longer to find a name. She has mixed up her children's names. She is more often insecure about how to reach a place by car, places she had driven to many times many years.  She never makes shopping lists and feels that her recall there is good. She takes Vyvanse for ADD- and started to take it daily throughout 2020 , at 20 Mg .  She went to the focus group.  PA Amy Gerilyn Nestle.   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 Family status is widowed, with  2 adult step-children, 2  Biological children, many grandchildren.  Tobacco use- none .  ETOH use ; occasionally, Caffeine intake in form of Coffee(1 cup) Soda( none) Tea ( hot) no energy drinks. Regular exercise in form of gym-ciruit training, which she has paused during [place], has a walking group.  Hobbies. Walking.   Family history, both parents died at age 48, mother was alcoholic and poor health, COPD. CAD, failure to thrive. Father MDD ,  Myelodysplastic disorder, had survived  lung cancer -    Review of Systems: Out of a complete 14 system review, the patient complains of only the following symptoms, and all  other reviewed systems are negative.:    Forgetfullnes, delayed recall.  ADD / ADHD   Social History   Socioeconomic History  . Marital status: Widowed    Spouse name: Not on file  . Number of children: 2  . Years of education: 67  . Highest education level: Not on file  Occupational History  . 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 Smoking status: Former Research scientist (life sciences)  . Smokeless tobacco: Never Used  . Tobacco comment: quit in 1978  Substance and Sexual Activity  . Alcohol use: Yes    Alcohol/week: 2.0 standard drinks    Types: 2 Standard drinks or equivalent per week  . Drug use: No  . Sexual activity: Yes     Partners: Male    Birth control/protection: Surgical, Post-menopausal    Comment: BTL  Other Topics Concern  . Not on file  Social History Narrative   Right handed      Lives in two story home. Lives alone. Widowed.      Retired, Holiday representative.       Social Determinants of Health   Financial Resource Strain:   . Difficulty of Paying Living Expenses:   Food Insecurity:   . Worried About Charity fundraiser in the Last Year:   . Arboriculturist in the Last Year:   Transportation Needs:   . Film/video editor (Medical):   Marland Kitchen Lack of Transportation (Non-Medical):   Physical Activity:   . Days of Exercise per Week:   . Minutes of Exercise per Session:   Stress:   . Feeling of Stress :   Social Connections:   . Frequency of Communication with Friends and Family:   . Frequency of Social Gatherings with Friends and Family:   . Attends Religious Services:   . Active Member of Clubs or Organizations:   . Attends Archivist Meetings:   Marland Kitchen Marital Status:     Family History  Problem Relation Age of Onset  . Transient ischemic attack Mother   . Arthritis Mother   . Hypertension Mother   . Ulcers Mother   . COPD Mother   . Hypothyroidism Mother   . Cancer - Lung Father   . Hypertension Father   . Diabetes Sister        whooping cough  . Diabetes Paternal Aunt   . Diabetes Paternal Uncle   . Cancer Paternal Grandmother        esophageal  . Hypertension Paternal Grandmother   . Hypertension Maternal Grandmother   . Heart disease Maternal Grandmother   . Hypothyroidism Sister   . Depression Other        maternal side  . Depression Other        paternal side    Past Medical History:  Diagnosis Date  . Erosive esophagitis    on nexium since, with good GERD relief  . GERD (gastroesophageal reflux disease)   . High cholesterol   . Hypothyroid   . Miscarriage    G3P2  . PE (pulmonary embolism) 1977   secondary falling down stairs with  subsequent renal failure  . Shingles 2014  . Vitamin B12 deficiency    due to small intestine resection, malabsorption , on shots  . Vitamin D deficiency     Past Surgical History:  Procedure Laterality Date  . APPENDECTOMY  1968  . BOWEL RESECTION  1979   for scar tissue and bowel  obstruction.near junction of colon and small bowel per patient  . BUNIONECTOMY  2004   scars on both feet  . LAPARASCOPY  1978  . TUBAL LIGATION       Current Outpatient Medications on File Prior to Visit  Medication Sig Dispense Refill  . cholecalciferol (VITAMIN D3) 25 MCG (1000 UT) tablet Take 1,000 Units by mouth daily.    . Cyanocobalamin 1000 MCG SUBL Inject as directed. One injection every 2 weeks    . VYVANSE 20 MG capsule Take 20 mg by mouth daily.  0  . zolpidem (AMBIEN) 5 MG tablet Take 2.5 mg by mouth at bedtime. Pt breaks 5mg  in half and takes prn  1   No current facility-administered medications on file prior to visit.    Allergies  Allergen Reactions  . Codeine   . Demerol [Meperidine]   . Penicillins     Physical exam:  Today's Vitals   12/29/19 1358  BP: 109/66  Pulse: 68  Temp: 97.9 F (36.6 C)  Weight: 134 lb (60.8 kg)  Height: 5' 5.5" (1.664 m)   Body mass index is 21.96 kg/m.   Wt Readings from Last 3 Encounters:  12/29/19 134 lb (60.8 kg)  11/15/19 134 lb (60.8 kg)  03/28/19 137 lb (62.1 kg)     Ht Readings from Last 3 Encounters:  12/29/19 5' 5.5" (1.664 m)  11/15/19 5\' 5"  (1.651 m)  03/28/19 5' 5.75" (1.67 m)      General: The patient is awake, alert and appears not in acute distress. The patient is well groomed. Head: Normocephalic, atraumatic. Neck is supple.  Cardiovascular:  Regular rate and cardiac rhythm by pulse,  without distended neck veins. Respiratory: Lungs are clear to auscultation.  Skin:  Without evidence of ankle edema, or rash. Trunk: The patient's posture is erect.   Neurologic exam : The patient is awake and alert, oriented to  place and time.   Memory subjective described as impaired- there is a lot of worry in her voice, concern that she may develop dementia.  Attention span & concentration ability appears normal.   Montreal Cognitive Assessment  12/29/2019  Visuospatial/ Executive (0/5) 4  Naming (0/3) 3  Attention: Read list of digits (0/2) 1  Attention: Read list of letters (0/1) 1  Attention: Serial 7 subtraction starting at 100 (0/3) 3  Language: Repeat phrase (0/2) 2  Language : Fluency (0/1) 1  Abstraction (0/2) 2  Delayed Recall (0/5) 4  Orientation (0/6) 6  Total 27    Speech is fluent,  without  dysarthria, dysphonia or aphasia.  Mood and affect are appropriate.   Cranial nerves:  loss of smell , started changing years ago - no change in  taste reported -  Pupils are equal and briskly reactive to light.  Funduscopic exam deferred.   Extraocular movements in vertical and horizontal planes were intact and without nystagmus. No Diplopia. Visual fields by finger perimetry are intact. Hearing was impaired to soft voices.   Facial sensation intact to fine touch. Facial motor strength is symmetric and tongue and uvula move midline.  Neck ROM : rotation, tilt and flexion extension were normal for age and shoulder shrug was symmetrical.    Motor exam:  Symmetric bulk, tone and ROM.   Normal tone without cog wheeling, symmetric grip strength . Sensory:  Fine touch, pinprick and vibration were tested  And she has a bit of finger numbness. tingling Proprioception tested in the upper extremities was normal. Coordination:  Rapid alternating movements in the fingers/hands were of normal speed.  The Finger-to-nose maneuver was intact without evidence of ataxia, dysmetria or tremor.   Gait and station: Patient could rise unassisted from a seated position, walked without assistive device.  Stance is of normal width/ base and the patient turned with 3- steps.  Toe and heel walk were deferred.  Deep tendon  reflexes: in the upper and lower extremities are symmetric and intact.  Babinski response was deferred.    After spending a total time of 49 minutes face to face and additional time for physical and neurologic examination, review of laboratory studies, b 12 schedule, yvanse schedule, and ADD confirmation. personal review of imaging studies, reports and results of other testing and review of referral information / records as far as provided in visit, I have established the following assessments:  1)  Attention issue rather than short term memory loss. MOCA confirmed  Excellent ST memory function. Did well on trail making test. No driving concerns.   2) indentation is not a neurological issue- I will order a skull x ray to rule out Paget's   3) B 12 is followed by PCP, she may consider under the tongue wafer or drops to avoid need for IM injection.    My Plan is to proceed with:  1) X ray of the  Skull  2) Yoga for Insomnia , for anxiety-  This has been related to the isolation of covid.    I would like to thank Dr. Nino Glow, MD and Veronica [name], [address] [place],  [place] [address] for allowing me to meet with and to take care of this pleasant patient.     I plan to follow up either personally or through our NP within 1-2 month for X ray follow up- otherwise PRN.  The patient is established with a primary neurologist already.    CC: I will share my notes with Dr Joylene Draft.   PS : The patient has been vaccinated for Covid 19.   Electronically signed by: Veronica Seat, MD 12/29/2019 2:23 PM  [facility] Board certified by The [employer] of Sleep Medicine and Diplomate of the [employer] of Sleep Medicine. Board certified In Neurology through the [place], Fellow of the [employer] of Neurology. Medical Director of [facility].

## 2019-12-29 NOTE — Patient Instructions (Signed)

## 2019-12-30 ENCOUNTER — Institutional Professional Consult (permissible substitution): Payer: Medicare Other | Admitting: Internal Medicine

## 2019-12-30 DIAGNOSIS — E538 Deficiency of other specified B group vitamins: Secondary | ICD-10-CM | POA: Diagnosis not present

## 2019-12-30 DIAGNOSIS — E559 Vitamin D deficiency, unspecified: Secondary | ICD-10-CM | POA: Diagnosis not present

## 2019-12-30 DIAGNOSIS — E7849 Other hyperlipidemia: Secondary | ICD-10-CM | POA: Diagnosis not present

## 2019-12-30 DIAGNOSIS — E038 Other specified hypothyroidism: Secondary | ICD-10-CM | POA: Diagnosis not present

## 2020-01-12 DIAGNOSIS — E559 Vitamin D deficiency, unspecified: Secondary | ICD-10-CM | POA: Diagnosis not present

## 2020-01-12 DIAGNOSIS — R3121 Asymptomatic microscopic hematuria: Secondary | ICD-10-CM | POA: Diagnosis not present

## 2020-01-12 DIAGNOSIS — K589 Irritable bowel syndrome without diarrhea: Secondary | ICD-10-CM | POA: Diagnosis not present

## 2020-01-12 DIAGNOSIS — J439 Emphysema, unspecified: Secondary | ICD-10-CM | POA: Diagnosis not present

## 2020-01-12 DIAGNOSIS — Z1331 Encounter for screening for depression: Secondary | ICD-10-CM | POA: Diagnosis not present

## 2020-01-12 DIAGNOSIS — H9190 Unspecified hearing loss, unspecified ear: Secondary | ICD-10-CM | POA: Diagnosis not present

## 2020-01-12 DIAGNOSIS — Z0000 Encounter for general adult medical examination without abnormal findings: Secondary | ICD-10-CM | POA: Diagnosis not present

## 2020-01-12 DIAGNOSIS — I700 Atherosclerosis of aorta: Secondary | ICD-10-CM | POA: Diagnosis not present

## 2020-01-12 DIAGNOSIS — F900 Attention-deficit hyperactivity disorder, predominantly inattentive type: Secondary | ICD-10-CM | POA: Diagnosis not present

## 2020-01-12 DIAGNOSIS — E8801 Alpha-1-antitrypsin deficiency: Secondary | ICD-10-CM | POA: Diagnosis not present

## 2020-01-12 DIAGNOSIS — B029 Zoster without complications: Secondary | ICD-10-CM | POA: Diagnosis not present

## 2020-01-12 DIAGNOSIS — M2550 Pain in unspecified joint: Secondary | ICD-10-CM | POA: Diagnosis not present

## 2020-02-23 ENCOUNTER — Telehealth: Payer: Self-pay | Admitting: Neurology

## 2020-02-23 NOTE — Telephone Encounter (Signed)
Pt called wanting to know about the update on the order for her CT scan. She wants to know if she even has to come for her scheduled appt since she has not had the CT done yet. Please advise.

## 2020-02-27 ENCOUNTER — Ambulatory Visit
Admission: RE | Admit: 2020-02-27 | Discharge: 2020-02-27 | Disposition: A | Discharge status: Home / Self Care | Payer: Medicare Other | Source: Ambulatory Visit | Attending: Neurology | Admitting: Neurology

## 2020-02-27 DIAGNOSIS — R202 Paresthesia of skin: Secondary | ICD-10-CM

## 2020-02-27 DIAGNOSIS — E538 Deficiency of other specified B group vitamins: Secondary | ICD-10-CM

## 2020-02-27 DIAGNOSIS — Z862 Personal history of diseases of the blood and blood-forming organs and certain disorders involving the immune mechanism: Secondary | ICD-10-CM

## 2020-02-27 DIAGNOSIS — M899 Disorder of bone, unspecified: Secondary | ICD-10-CM

## 2020-02-27 DIAGNOSIS — R220 Localized swelling, mass and lump, head: Secondary | ICD-10-CM | POA: Diagnosis not present

## 2020-02-27 NOTE — Telephone Encounter (Signed)
Called the patient and advised of the information below. Informed her that she can walk in for Xray's to be completed. Informed her that once the xray is completed then Dr Brett Fairy will get the result and then I will be in contact with the results. Apologized to the patient for no one advising this is something that she can walk in for. Pt verbalized understanding of walking in and provided the phone number in case she would like to call [facility].

## 2020-02-27 NOTE — Telephone Encounter (Signed)
Pt called to verify if she should push back apt. Revised notes and canceled apt. Pt requested a CB to discuss who she should have her FU apt with. Pt would also like to check in on when she will be scheduled for as she has been waiting for about 2 months for her imaging and would like to get scheduled as soon as possible.

## 2020-02-28 ENCOUNTER — Telehealth: Payer: Self-pay | Admitting: Neurology

## 2020-02-28 ENCOUNTER — Ambulatory Visit: Payer: Medicare Other | Admitting: Obstetrics & Gynecology

## 2020-02-28 ENCOUNTER — Encounter: Payer: Self-pay | Admitting: Neurology

## 2020-02-28 ENCOUNTER — Ambulatory Visit: Payer: Medicare Other | Admitting: Adult Health

## 2020-02-28 ENCOUNTER — Other Ambulatory Visit: Payer: Self-pay

## 2020-02-28 ENCOUNTER — Ambulatory Visit: Payer: Medicare Other | Admitting: Neurology

## 2020-02-28 ENCOUNTER — Ambulatory Visit (INDEPENDENT_AMBULATORY_CARE_PROVIDER_SITE_OTHER): Payer: Medicare Other | Admitting: Neurology

## 2020-02-28 VITALS — BP 118/76 | HR 72 | Ht 65.5 in | Wt 135.0 lb

## 2020-02-28 DIAGNOSIS — F5109 Other insomnia not due to a substance or known physiological condition: Secondary | ICD-10-CM

## 2020-02-28 DIAGNOSIS — R0683 Snoring: Secondary | ICD-10-CM

## 2020-02-28 DIAGNOSIS — F519 Sleep disorder not due to a substance or known physiological condition, unspecified: Secondary | ICD-10-CM | POA: Insufficient documentation

## 2020-02-28 NOTE — Telephone Encounter (Signed)
-----   Message from Larey Seat, MD sent at 02/28/2020  9:07 AM EDT ----- No evidence of paget's disease- this is a good result ! CD

## 2020-02-28 NOTE — Progress Notes (Signed)
SLEEP MEDICINE CLINIC    Provider:  Larey Seat, MD  Primary Care Physician:  Crist Infante, MD [place] [address]     Referring Provider:  Nino Glow, MD Gynaecology.     Crist Infante, [facility] [address] [place],  [place] [address]          Chief Complaint according to patient   Patient presents with:    . New Patient (Initial Visit)     5-6 mths ago, noticed a indentation on scalp and she felt it came more defined. she has been having some difficulties with headaches and vision concerns. she has noticed some memory concerns as well.  She has lichen planus and she has seen Dr. Posey Pronto just last summer for sensory abnormality- she has EMG and NCV at [facility].  History of B 12 deficiency - treated with Injection.      HISTORY OF PRESENT ILLNESS:  Veronica [name] is a 69 year old  Caucasian female patient seen here in a revisit on 02/28/2020 ,  The patient underwent a skeletal x-ray in 3 planes, showing no lytic osteogenic lesions, no evidence of Paget's disease.  I was able to give her the is good news and Mrs. Parham asked an additional question.  She reports waking up by her own snoring, sometimes this can be gasping sometimes more choking.  She had undergone a sleep study in the year 2014 which did not indicate the presence of apnea.  My concern is that she may well have upper airway resistance syndrome.date I would like for her to undergo a home sleep test just to verify that there is still no apnea, and then depending on the results of confirmation yes or no a referral to a sleep medicine specialist dentist. her sleep app has been indicating fragmented sleep.   12-29-2019,from her gynecologist.  Chief concern according to patient : " I have this new indention in my scalp".    I have the pleasure of seeing Veronica [name]  who has a  has a past medical history of Erosive esophagitis, GERD (gastroesophageal reflux disease), High cholesterol,  Hypothyroid, Miscarriage, PE (pulmonary embolism) (1977), Shingles (2014), Vitamin B12 deficiency, and Vitamin D deficiency.  She has chronic absorption difficulties with vitamin B12 and now during the Covid pandemic change to giving herself intramuscular injections of cyanocobalamin.  Her level remains low normal.  Back to the main concern addressed today.  The patient reports that she has noted a scalp lesion and indentation in the scalp that she had not noticed before 5 or 6 months ago.  She frequently massages her scalp, as  a sideeffect of the lichen condition that is underlying.  However she is sure that this was a new finding.  She points to the frontal midline suture and allowed me to palpate her scalp, which indeed has an indentation the size of a quarter-on each side of the elevated ridge.     She is also concerned about possible memory changes. She needs longer to find a name. She has mixed up her children's names. She is more often insecure about how to reach a place by car, places she had driven to many times many years.  She never makes shopping lists and feels that her recall there is good. She takes Vyvanse for ADD- and started to take it daily throughout 2020 , at 20 Mg .  She went to the focus group.  PA Amy Gerilyn Nestle.  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 Family status is widowed, with  2 adult step-children, 2  Biological children, many grandchildren.  Tobacco use- none .  ETOH use ; occasionally, Caffeine intake in form of Coffee(1 cup) Soda( none) Tea ( hot) no energy drinks. Regular exercise in form of gym-ciruit training, which she has paused during [place], has a walking group.  Hobbies. Walking.   Family history, both parents died at age 40, mother was alcoholic and poor health, COPD. CAD, failure to thrive. Father MDD ,  Myelodysplastic disorder, had survived  lung cancer -    Review of Systems: Out of a  complete 14 system review, the patient complains of only the following symptoms, and all other reviewed systems are negative.:    Forgetfullnes, delayed recall.  ADD / ADHD   Social History   Socioeconomic History  . Marital status: Widowed    Spouse name: Not on file  . Number of children: 2  . Years of education: 46  . Highest education level: Not on file  Occupational History  . 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 Smoking status: Former Research scientist (life sciences)  . Smokeless tobacco: Never Used  . Tobacco comment: quit in 1978  Substance and Sexual Activity  . Alcohol use: Yes    Alcohol/week: 2.0 standard drinks    Types: 2 Standard drinks or equivalent per week  . Drug use: No  . Sexual activity: Yes    Partners: Male    Birth control/protection: Surgical, Post-menopausal    Comment: BTL  Other Topics Concern  . Not on file  Social History Narrative   Right handed      Lives in two story home. Lives alone. Widowed.      Retired, Holiday representative.       Social Determinants of Health   Financial Resource Strain:   . Difficulty of Paying Living Expenses:   Food Insecurity:   . Worried About Charity fundraiser in the Last Year:   . Arboriculturist in the Last Year:   Transportation Needs:   . Film/video editor (Medical):   Marland Kitchen Lack of Transportation (Non-Medical):   Physical Activity:   . Days of Exercise per Week:   . Minutes of Exercise per Session:   Stress:   . Feeling of Stress :   Social Connections:   . Frequency of Communication with Friends and Family:   . Frequency of Social Gatherings with Friends and Family:   . Attends Religious Services:   . Active Member of Clubs or Organizations:   . Attends Archivist Meetings:   Marland Kitchen Marital Status:     Family History  Problem Relation Age of Onset  . Transient ischemic attack Mother   . Arthritis Mother   . Hypertension Mother   . Ulcers Mother   . COPD  Mother   . Hypothyroidism Mother   . Cancer - Lung Father   . Hypertension Father   . Diabetes Sister        whooping cough  . Diabetes Paternal Aunt   . Diabetes Paternal Uncle   . Cancer Paternal Grandmother        esophageal  . Hypertension Paternal Grandmother   . Hypertension Maternal Grandmother   . Heart disease Maternal Grandmother   . Hypothyroidism Sister   . Depression Other  maternal side  . Depression Other        paternal side    Past Medical History:  Diagnosis Date  . Erosive esophagitis    on nexium since, with good GERD relief  . GERD (gastroesophageal reflux disease)   . High cholesterol   . Hypothyroid   . Miscarriage    G3P2  . PE (pulmonary embolism) 1977   secondary falling down stairs with subsequent renal failure  . Shingles 2014  . Vitamin B12 deficiency    due to small intestine resection, malabsorption , on shots  . Vitamin D deficiency     Past Surgical History:  Procedure Laterality Date  . APPENDECTOMY  1968  . BOWEL RESECTION  1979   for scar tissue and bowel obstruction.near junction of colon and small bowel per patient  . BUNIONECTOMY  2004   scars on both feet  . LAPARASCOPY  1978  . TUBAL LIGATION       Current Outpatient Medications on File Prior to Visit  Medication Sig Dispense Refill  . cholecalciferol (VITAMIN D3) 25 MCG (1000 UT) tablet Take 1,000 Units by mouth daily.    . Cyanocobalamin 1000 MCG SUBL Inject as directed. One injection every 2 weeks    . VYVANSE 20 MG capsule Take 20 mg by mouth daily.  0  . zolpidem (AMBIEN) 5 MG tablet Take 2.5 mg by mouth at bedtime. Pt breaks 5mg  in half and takes prn  1   No current facility-administered medications on file prior to visit.    Allergies  Allergen Reactions  . Codeine   . Demerol [Meperidine]   . Penicillins     Physical exam:  Today's Vitals   02/28/20 1328  BP: 118/76  Pulse: 72  Weight: 135 lb (61.2 kg)  Height: 5' 5.5" (1.664 m)   Body  mass index is 22.12 kg/m.   Wt Readings from Last 3 Encounters:  02/28/20 135 lb (61.2 kg)  12/29/19 134 lb (60.8 kg)  11/15/19 134 lb (60.8 kg)     Ht Readings from Last 3 Encounters:  02/28/20 5' 5.5" (1.664 m)  12/29/19 5' 5.5" (1.664 m)  11/15/19 5\' 5"  (1.651 m)      General: The patient is awake, alert and appears not in acute distress. The patient is well groomed. Head: Normocephalic, atraumatic. Neck is supple.  Cardiovascular:  Regular rate and cardiac rhythm by pulse,  without distended neck veins. Respiratory: Lungs are clear to auscultation.  Skin:  Without evidence of ankle edema, or rash. Trunk: The patient's posture is erect.   Neurologic exam : The patient is awake and alert, oriented to place and time.   Memory subjective described as impaired- there is a lot of worry in her voice, concern that she may develop dementia.  Attention span & concentration ability appears normal.   Montreal Cognitive Assessment  12/29/2019  Visuospatial/ Executive (0/5) 4  Naming (0/3) 3  Attention: Read list of digits (0/2) 1  Attention: Read list of letters (0/1) 1  Attention: Serial 7 subtraction starting at 100 (0/3) 3  Language: Repeat phrase (0/2) 2  Language : Fluency (0/1) 1  Abstraction (0/2) 2  Delayed Recall (0/5) 4  Orientation (0/6) 6  Total 27    Speech is fluent,  without  dysarthria, dysphonia or aphasia.  Mood and affect are appropriate.   Cranial nerves:  loss of smell , started changing years ago - no change in  taste reported -  Pupils are  equal and briskly reactive to light.  Funduscopic exam deferred.   Extraocular movements in vertical and horizontal planes were intact and without nystagmus. No Diplopia. Visual fields by finger perimetry are intact. Hearing was impaired to soft voices.   Facial sensation intact to fine touch. Facial motor strength is symmetric and tongue and uvula move midline.  Neck ROM : rotation, tilt and flexion extension were  normal for age and shoulder shrug was symmetrical.    Motor exam:  Symmetric bulk, tone and ROM.   Normal tone without cog wheeling, symmetric grip strength . Deep tendon reflexes: in the upper and lower extremities are symmetric and intact.  Babinski response was deferred.    After spending a total time of 49 minutes face to face and additional time for physical and neurologic examination, review of laboratory studies, b 12 schedule, yvanse schedule, and ADD confirmation. personal review of imaging studies, reports and results of other testing and review of referral information / records as far as provided in visit, I have established the following assessments:  1)  Attention issue rather than short term memory loss. MOCA confirmed excellent ST memory function. Did well on trail making test. No driving concerns.   2) indentation is not a neurological issue- skull x ray ruled out Paget's disease.  3) snoring related sleep fragmentation, confirm HST for UARS verus apnea.    My Plan is to proceed with:  I would like to thank Dr. Nino Glow, MD and Crist Infante, [place] [address] [place],  [place] [address] for allowing me to meet with and to take care of this pleasant patient.    HST preferred for UARS.     CC: I will share my notes with Dr Joylene Draft.   PS : The patient has been vaccinated for Covid 19.     Electronically signed by: Larey Seat, MD 02/28/2020 1:55 PM  [facility] and [employer] certified by The [employer] of Sleep Medicine and Diplomate of the [employer] of Sleep Medicine. Board certified In Neurology through the [employer], Fellow of the [employer] of Neurology. Medical Director of [employer].

## 2020-02-28 NOTE — Patient Instructions (Signed)

## 2020-02-28 NOTE — Telephone Encounter (Signed)
Called the patient and reviewed of this information. Now this is completed patient would like to come in for her follow up visit. There was an opening today and patient was scheduled. Pt verbalized understanding. Pt had no questions at this time but was encouraged to call back if questions arise.

## 2020-02-28 NOTE — Progress Notes (Signed)
No evidence of paget's disease- this is a good result ! CD

## 2020-03-02 ENCOUNTER — Ambulatory Visit: Payer: Medicare Other | Admitting: Obstetrics & Gynecology

## 2020-05-21 ENCOUNTER — Other Ambulatory Visit: Payer: Self-pay

## 2020-05-21 ENCOUNTER — Ambulatory Visit (INDEPENDENT_AMBULATORY_CARE_PROVIDER_SITE_OTHER): Payer: Medicare Other | Admitting: Neurology

## 2020-05-21 DIAGNOSIS — F519 Sleep disorder not due to a substance or known physiological condition, unspecified: Secondary | ICD-10-CM

## 2020-05-21 DIAGNOSIS — F5109 Other insomnia not due to a substance or known physiological condition: Secondary | ICD-10-CM

## 2020-05-21 DIAGNOSIS — R0683 Snoring: Secondary | ICD-10-CM

## 2020-05-21 DIAGNOSIS — G4733 Obstructive sleep apnea (adult) (pediatric): Secondary | ICD-10-CM | POA: Diagnosis not present

## 2020-05-29 ENCOUNTER — Telehealth: Payer: Self-pay | Admitting: Neurology

## 2020-05-29 NOTE — Progress Notes (Signed)
Summary & Diagnosis:   AHI of 13.1 is s consistent with mild OSA but strong REM sleep  dependence is also noted- REM AHI was 21.1/h. No associated  hypoxemia or abnormal heart rate variability. Sleeping in supine  or right lateral position had the same high AHI effect.   Recommendations:    REM sleep dependent OSA can be best addressed with PAP therapy,  including CPAP or BiPAP. Autotitration device for CPAP therapy  can be ordered and follow up on therapy offered in 60-90 days.  Settings will be 5-15 cm water 2 cm EPR and mask of patient's  choice and comfort. Please note that a ental device is unlikely  as effective for REM sleep dependent apnea.   Interpreting Physician: Larey Seat, MD

## 2020-05-29 NOTE — Addendum Note (Signed)
Addended by: Larey Seat on: 05/29/2020 01:37 PM   Modules accepted: Orders

## 2020-05-29 NOTE — Procedures (Signed)
Patient Information     First Name: Veronica Last Name: Thomasenia Veronica [name] ID: [id number]  Birth Date:      [birth year] Age:   69  Gender:  Female  Referring Provider: Nino Glow, MD and Crist Infante, MD BMI:       Sleep Study Information     Study Date: 05/21/20 S/H/A Version: 001.001.001.001 / 4.1.1528 / 77  History:     Veronica Veronica [name] is a 69 year old  Caucasian female patient and was seen here in a revisit on 02/28/2020 ,   She reports waking up by her own snoring, sometimes this can be gasping sometimes more choking.  She had undergone a sleep study in the year 2014 which did not indicate the presence of apnea.  My concern is that she may well have upper airway resistance syndrome. I would like for her to undergo a home sleep test just to verify that there is still no apnea, and then depending on the results of confirmation yes or no a referral to a sleep medicine specialist dentist. her sleep app has been indicating fragmented sleep.      Summary & Diagnosis:    AHI of 13.1 is s consistent with mild OSA but strong REM sleep dependence is also noted- REM AHI was 21.1/h. No associated hypoxemia or abnormal heart rate variability. Sleeping in supine or right lateral position had the same high AHI effect.   Recommendations:     REM sleep dependent OSA can be best addressed with PAP therapy, including CPAP or BiPAP. Autotitration device for CPAP therapy can be ordered and follow up on therapy offered in 60-90 days. Settings Veronica [name] be 5-15 cm water 2 cm EPR and mask of patient's choice and comfort. Please note that a ental device is unlikely as effective for REM sleep dependent apnea.   Interpreting Physician: Larey Seat, MD          Sleep Summary  Oxygen Saturation Statistics   Start Study Time: End Study Time: Total Recording Time:  11:00:27PM 8:28:59 AM 9 hrs, 27min  Total Sleep Time % REM of Sleep Time:  8 hrs, 30 min 25.8    Mean: 95 Minimum: 91 Maximum: 99  Mean of Desaturations  Nadirs (%):   93  Oxygen Desatur. %: 4-9 10-20 >20 Total  Events Number Total  15 100.0  0 0.0  0 0.0  15 100.0  Oxygen Saturation: <90 <=88 <85 <80 <70  Duration (minutes): Sleep % 0.0 0.0 0.0 0.0 0.0 0.0 0.0 0.0 0.0 0.0     Respiratory Indices      Total Events REM NREM All Night  pRDI:  118 p AHI:  111 ODI:  15  pAHIc:  8  % CSR: 0.0 22.0 21.1 1.4 1.2 11.1 10.3 1.9 1.0 13.9 13.1 1.8 1.0       Pulse Rate Statistics during Sleep (BPM)      Mean: 55 Minimum: 46 Maximum: 91    Indices are calculated using technically valid sleep time of  8 hrs, 28 min. Central-Indices are calculated using technically valid sleep time of  7  hrs, 46 min. pRDI/pAHI are calculated using oxi desaturations ? 3%   Body Position Statistics  Position Supine Prone Right Left Non-Supine  Sleep (min) 241.5 239.0 29.5 0.0 268.5  Sleep % 47.4 46.9 5.8 0.0 52.6  pRDI 19.2 7.8 20.4 N/A 9.2  pAHI 18.2 7.3 18.4 N/A 8.5  ODI 2.7 0.5 4.1 N/A 0.9     Snoring  Statistics Snoring Level (dB) >40 >50 >60 >70 >80 >Threshold (45)  Sleep (min) 155.4 16.3 1.2 0.0 0.0 51.6  Sleep % 30.5 3.2 0.2 0.0 0.0 10.1    Mean: 41 dB

## 2020-05-29 NOTE — Telephone Encounter (Signed)
-----   Message from Larey Seat, MD sent at 05/29/2020  1:37 PM EDT ----- Summary & Diagnosis:   AHI of 13.1 is s consistent with mild OSA but strong REM sleep  dependence is also noted- REM AHI was 21.1/h. No associated  hypoxemia or abnormal heart rate variability. Sleeping in supine  or right lateral position had the same high AHI effect.   Recommendations:    REM sleep dependent OSA can be best addressed with PAP therapy,  including CPAP or BiPAP. Autotitration device for CPAP therapy  can be ordered and follow up on therapy offered in 60-90 days.  Settings will be 5-15 cm water 2 cm EPR and mask of patient's  choice and comfort. Please note that a ental device is unlikely  as effective for REM sleep dependent apnea.   Interpreting Physician: Larey Seat, MD

## 2020-05-29 NOTE — Telephone Encounter (Signed)
I called pt. I advised pt that Dr. Brett Fairy reviewed their sleep study results and found that pt mild sleep apnea. Dr. Brett Fairy recommends that pt starts auto CPAP. I reviewed PAP compliance expectations with the pt. Pt is agreeable to starting a CPAP. I advised pt that an order will be sent to a DME, [facility] and [facility] [facility]) will call the pt within about one week after they file with the pt's insurance. [facility]) will show the pt how to use the machine, fit for masks, and troubleshoot the CPAP if needed. A follow up appt was made for insurance purposes with Ward Givens, NP on 08/09/2020 at 11:30 am. Pt verbalized understanding to arrive 15 minutes early and bring their CPAP. A letter with all of this information in it will be mailed to the pt as a reminder. I verified with the pt that the address we have on file is correct. Pt verbalized understanding of results. Pt had no questions at this time but was encouraged to call back if questions arise. I have sent the order to [facility]) and have received confirmation that they have received the order.

## 2020-06-06 DIAGNOSIS — Z20828 Contact with and (suspected) exposure to other viral communicable diseases: Secondary | ICD-10-CM | POA: Diagnosis not present

## 2020-06-06 DIAGNOSIS — Z1152 Encounter for screening for COVID-19: Secondary | ICD-10-CM | POA: Diagnosis not present

## 2020-06-06 DIAGNOSIS — R432 Parageusia: Secondary | ICD-10-CM | POA: Diagnosis not present

## 2020-06-08 ENCOUNTER — Institutional Professional Consult (permissible substitution): Payer: Medicare Other | Admitting: Emergency Medicine

## 2020-06-13 DIAGNOSIS — Z23 Encounter for immunization: Secondary | ICD-10-CM | POA: Diagnosis not present

## 2020-06-22 DIAGNOSIS — L821 Other seborrheic keratosis: Secondary | ICD-10-CM | POA: Diagnosis not present

## 2020-06-22 DIAGNOSIS — L819 Disorder of pigmentation, unspecified: Secondary | ICD-10-CM | POA: Diagnosis not present

## 2020-06-22 DIAGNOSIS — L570 Actinic keratosis: Secondary | ICD-10-CM | POA: Diagnosis not present

## 2020-06-22 DIAGNOSIS — L814 Other melanin hyperpigmentation: Secondary | ICD-10-CM | POA: Diagnosis not present

## 2020-06-22 DIAGNOSIS — D225 Melanocytic nevi of trunk: Secondary | ICD-10-CM | POA: Diagnosis not present

## 2020-07-03 ENCOUNTER — Institutional Professional Consult (permissible substitution): Payer: Medicare Other | Admitting: Internal Medicine

## 2020-07-13 DIAGNOSIS — B029 Zoster without complications: Secondary | ICD-10-CM | POA: Diagnosis not present

## 2020-07-13 DIAGNOSIS — Z780 Asymptomatic menopausal state: Secondary | ICD-10-CM | POA: Diagnosis not present

## 2020-07-13 DIAGNOSIS — R5383 Other fatigue: Secondary | ICD-10-CM | POA: Diagnosis not present

## 2020-07-13 DIAGNOSIS — F32A Depression, unspecified: Secondary | ICD-10-CM | POA: Diagnosis not present

## 2020-07-13 DIAGNOSIS — E559 Vitamin D deficiency, unspecified: Secondary | ICD-10-CM | POA: Diagnosis not present

## 2020-07-13 DIAGNOSIS — E538 Deficiency of other specified B group vitamins: Secondary | ICD-10-CM | POA: Diagnosis not present

## 2020-07-13 DIAGNOSIS — Z23 Encounter for immunization: Secondary | ICD-10-CM | POA: Diagnosis not present

## 2020-07-13 DIAGNOSIS — F900 Attention-deficit hyperactivity disorder, predominantly inattentive type: Secondary | ICD-10-CM | POA: Diagnosis not present

## 2020-07-16 ENCOUNTER — Other Ambulatory Visit: Payer: Self-pay

## 2020-07-16 ENCOUNTER — Encounter: Payer: Self-pay | Admitting: Internal Medicine

## 2020-07-16 ENCOUNTER — Ambulatory Visit (INDEPENDENT_AMBULATORY_CARE_PROVIDER_SITE_OTHER): Payer: Medicare Other | Admitting: Internal Medicine

## 2020-07-16 VITALS — BP 124/74 | HR 59 | Temp 97.9°F | Ht 65.5 in | Wt 138.0 lb

## 2020-07-16 DIAGNOSIS — J432 Centrilobular emphysema: Secondary | ICD-10-CM

## 2020-07-16 DIAGNOSIS — Z9989 Dependence on other enabling machines and devices: Secondary | ICD-10-CM

## 2020-07-16 DIAGNOSIS — G4733 Obstructive sleep apnea (adult) (pediatric): Secondary | ICD-10-CM | POA: Diagnosis not present

## 2020-07-16 NOTE — Progress Notes (Signed)
Veronica [name]    [id number]    [birth year]  Primary Care Physician:Perini, Elta Guadel[place], MD  Referring Physician: Megan Salon, MD [address] [place] [place],  [place] [address] Reason for Consultation: "emphysema" Date of Consultation: 07/16/2020  Chief complaint:   Chief Complaint  Patient presents with  . Consult    hx of emphysema     HPI: The patient is a 69 year old woman who is in good health.  She is here for follow up on a CT coronary calcium score which demonstrated mild emphysema.   No dyspnea, walks fast regularly. Recently completed [place] in [place].  No recurrent pneumonia or bronchitis. Recently diagnosed with OSA and is supposed to get a CPAP machine. Additionally has history of PE following a mechanical fall during the hospital stay. She was with [facility] for 3-6 months.   Both parents smoked. Father had lung cancer and he was a smoker. She spent most of her childhood with grandparents who were not smokers.  [place] had wood burning stove in the home from 8-14.   She denies any difficulties with activities of daily living.  Social history:  Occupation: Scientist, research (life sciences) estate Exposures: no pets, had dogs and cats in the past. Originally from [place].  Smoking history: very mild remote smoking history detailed below.  Social History   Occupational History  . 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 Smoking status: Former Smoker    Packs/day: 0.50    Years: 2.00    Pack years: 1.00  . Smokeless tobacco: Never Used  . Tobacco comment: quit in 1978  Vaping Use  . Vaping Use: Never used  Substance and Sexual Activity  . Alcohol use: Yes    Alcohol/week: 2.0 standard drinks    Types: 2 Standard drinks or equivalent per week  . Drug use: No  . Sexual activity: Yes    Partners: Male    Birth control/protection: Surgical, Post-menopausal    Comment: BTL    Relevant family history: Family History  Problem  Relation Age of Onset  . Transient ischemic attack Mother   . Arthritis Mother   . Hypertension Mother   . Ulcers Mother   . COPD Mother   . Hypothyroidism Mother   . Cancer - Lung Father   . Hypertension Father   . Diabetes Sister        whooping cough  . Diabetes Paternal Aunt   . Diabetes Paternal Uncle   . Cancer Paternal Grandmother        esophageal  . Hypertension Paternal Grandmother   . Hypertension Maternal Grandmother   . Heart disease Maternal Grandmother   . Hypothyroidism Sister   . Depression Other        maternal side  . Depression Other        paternal side    Past Medical History:  Diagnosis Date  . Erosive esophagitis    on nexium since, with good GERD relief  . GERD (gastroesophageal reflux disease)   . High cholesterol   . Hypothyroid   . Miscarriage    G3P2  . PE (pulmonary embolism) 1977   secondary falling down stairs with subsequent renal failure  . Shingles 2014  . Vitamin B12 deficiency    due to small intestine resection, malabsorption , on shots  . Vitamin D deficiency     Past Surgical History:  Procedure Laterality  Date  . [place]  . BOWEL RESECTION  1979   for scar tissue and bowel obstruction.near junction of colon and small bowel per patient  . BUNIONECTOMY  2004   scars on both feet  . LAPARASCOPY  1978  . TUBAL LIGATION      Physical Exam: Blood pressure 124/74, pulse (!) 59, temperature 97.9 F (36.6 C), temperature source Oral, height 5' 5.5" (1.664 m), weight 138 lb (62.6 kg), last menstrual period 10/13/2002, SpO2 98 %. Gen:      No acute distress ENT:  no nasal polyps, mucus membranes moist, Mallampati 3 Lungs:    No increased respiratory effort, symmetric chest wall excursion, clear to auscultation bilaterally, no wheezes or crackles CV:         Regular rate and rhythm; no murmurs, rubs, or gallops.  No pedal edema Abd:      + bowel sounds; soft, non-tender; no distension MSK: no acute synovitis of DIP  or PIP joints, no mechanics hands.  Skin:      Warm and dry; no rashes Neuro: normal speech, no focal facial asymmetry Psych: alert and oriented x3, normal mood and affect   Data Reviewed/Medical Decision Making:  Independent interpretation of tests: Imaging: . Review of patient's CT cardiac scoring Feb 2020 images revealed mild bronchiectasis and centrilobular emphysema. The patient's images have been independently reviewed by me.    PFTs: None on file  Labs:  Lab Results  Component Value Date   WBC 5.6 05/23/2016   HGB 13.9 05/23/2016   HCT 41.9 05/23/2016   MCV 97.2 05/23/2016   PLT 257 05/23/2016   Lab Results  Component Value Date   NA 142 11/15/2019   K 4.6 11/15/2019   CL 104 11/15/2019   CO2 20 11/15/2019     Immunization status:  Immunization History  Administered Date(s) Administered  . PFIZER SARS-COV-2 Vaccination 11/02/2019, 11/20/2019, 06/13/2020    . I reviewed prior external note(s) from neurology, Primary care . I reviewed the result(s) of the labs and imaging as noted above.   Assessment:  Emphysema -mild stable Mild OSA on CPAP -undergoing control  Plan/Recommendations: Veronica [name] is a 69 year old woman with incidental findings of centrilobular emphysema noted on her coronary CT chest.  She is currently asymptomatic from his mild finding and has a very remote smoking history.  Suspect that her childhood exposure to wood-burning stove is is also contributed to her emphysema.  She is up-to-date on all vaccinations including pneumonia vaccines, Covid vaccine, and influenza vaccine.  We discussed at length strategies to prevent progression of her disease including continue to abstain from smoking, obtaining appropriate vaccinations, and staying active.  She had done some PFTs at an outside facility and we will obtain records of those still on file.  She does not meet criteria for lung cancer screening with low-dose CT. at this point, happy to see her back  as needed should there be a change in her clinical symptoms.     Return to Care: Return if symptoms worsen or fail to improve, for emphysema.  Lenice Llamas, MD Pulmonary and [place]  CC: Megan Salon, MD

## 2020-08-09 ENCOUNTER — Ambulatory Visit: Payer: Self-pay | Admitting: Adult Health

## 2020-09-04 DIAGNOSIS — J439 Emphysema, unspecified: Secondary | ICD-10-CM | POA: Diagnosis not present

## 2020-09-04 DIAGNOSIS — Z1152 Encounter for screening for COVID-19: Secondary | ICD-10-CM | POA: Diagnosis not present

## 2020-09-04 DIAGNOSIS — R059 Cough, unspecified: Secondary | ICD-10-CM | POA: Diagnosis not present

## 2020-09-12 ENCOUNTER — Other Ambulatory Visit: Payer: Self-pay | Admitting: Obstetrics & Gynecology

## 2020-09-12 DIAGNOSIS — L821 Other seborrheic keratosis: Secondary | ICD-10-CM | POA: Diagnosis not present

## 2020-09-12 DIAGNOSIS — L570 Actinic keratosis: Secondary | ICD-10-CM | POA: Diagnosis not present

## 2020-09-12 DIAGNOSIS — Z1231 Encounter for screening mammogram for malignant neoplasm of breast: Secondary | ICD-10-CM

## 2020-09-17 ENCOUNTER — Ambulatory Visit
Admission: RE | Admit: 2020-09-17 | Discharge: 2020-09-17 | Disposition: A | Discharge status: Home / Self Care | Payer: Medicare Other | Source: Ambulatory Visit | Attending: Obstetrics & Gynecology | Admitting: Obstetrics & Gynecology

## 2020-09-17 ENCOUNTER — Other Ambulatory Visit: Payer: Self-pay

## 2020-09-17 DIAGNOSIS — Z1231 Encounter for screening mammogram for malignant neoplasm of breast: Secondary | ICD-10-CM | POA: Diagnosis not present

## 2020-09-30 DIAGNOSIS — Z20822 Contact with and (suspected) exposure to covid-19: Secondary | ICD-10-CM | POA: Diagnosis not present

## 2020-09-30 DIAGNOSIS — R059 Cough, unspecified: Secondary | ICD-10-CM | POA: Diagnosis not present

## 2020-09-30 DIAGNOSIS — H65112 Acute and subacute allergic otitis media (mucoid) (sanguinous) (serous), left ear: Secondary | ICD-10-CM | POA: Diagnosis not present

## 2020-10-25 ENCOUNTER — Telehealth: Payer: Self-pay

## 2020-10-25 NOTE — Telephone Encounter (Signed)
Spoke to pt she states she is waiting for a mask refitting, which is why she is unable to use cpap, r/s her appt, and pt will call back if she needs a new order

## 2020-10-30 ENCOUNTER — Ambulatory Visit: Payer: Medicare Other | Admitting: Adult Health

## 2020-11-10 DIAGNOSIS — Z20822 Contact with and (suspected) exposure to covid-19: Secondary | ICD-10-CM | POA: Diagnosis not present

## 2020-11-21 DIAGNOSIS — Z20822 Contact with and (suspected) exposure to covid-19: Secondary | ICD-10-CM | POA: Diagnosis not present

## 2021-01-04 DIAGNOSIS — E785 Hyperlipidemia, unspecified: Secondary | ICD-10-CM | POA: Diagnosis not present

## 2021-01-04 DIAGNOSIS — E039 Hypothyroidism, unspecified: Secondary | ICD-10-CM | POA: Diagnosis not present

## 2021-01-04 DIAGNOSIS — E559 Vitamin D deficiency, unspecified: Secondary | ICD-10-CM | POA: Diagnosis not present

## 2021-01-04 DIAGNOSIS — E538 Deficiency of other specified B group vitamins: Secondary | ICD-10-CM | POA: Diagnosis not present

## 2021-01-14 DIAGNOSIS — F900 Attention-deficit hyperactivity disorder, predominantly inattentive type: Secondary | ICD-10-CM | POA: Diagnosis not present

## 2021-01-14 DIAGNOSIS — I700 Atherosclerosis of aorta: Secondary | ICD-10-CM | POA: Diagnosis not present

## 2021-01-14 DIAGNOSIS — E538 Deficiency of other specified B group vitamins: Secondary | ICD-10-CM | POA: Diagnosis not present

## 2021-01-14 DIAGNOSIS — Z1331 Encounter for screening for depression: Secondary | ICD-10-CM | POA: Diagnosis not present

## 2021-01-14 DIAGNOSIS — E559 Vitamin D deficiency, unspecified: Secondary | ICD-10-CM | POA: Diagnosis not present

## 2021-01-14 DIAGNOSIS — Z0000 Encounter for general adult medical examination without abnormal findings: Secondary | ICD-10-CM | POA: Diagnosis not present

## 2021-01-14 DIAGNOSIS — U071 COVID-19: Secondary | ICD-10-CM | POA: Diagnosis not present

## 2021-01-14 DIAGNOSIS — F4320 Adjustment disorder, unspecified: Secondary | ICD-10-CM | POA: Diagnosis not present

## 2021-01-14 DIAGNOSIS — J029 Acute pharyngitis, unspecified: Secondary | ICD-10-CM | POA: Diagnosis not present

## 2021-01-14 DIAGNOSIS — E785 Hyperlipidemia, unspecified: Secondary | ICD-10-CM | POA: Diagnosis not present

## 2021-01-14 DIAGNOSIS — R1319 Other dysphagia: Secondary | ICD-10-CM | POA: Diagnosis not present

## 2021-01-14 DIAGNOSIS — G629 Polyneuropathy, unspecified: Secondary | ICD-10-CM | POA: Diagnosis not present

## 2021-01-21 IMAGING — MG DIGITAL SCREENING BILAT W/ TOMO W/ CAD
8 series · 9 of 24 positions shown · non-contrast
Comparison: Previous exam(s).

CLINICAL DATA: Screening.

EXAM:
DIGITAL SCREENING BILATERAL MAMMOGRAM WITH TOMO AND CAD

[R CC synth-2D]
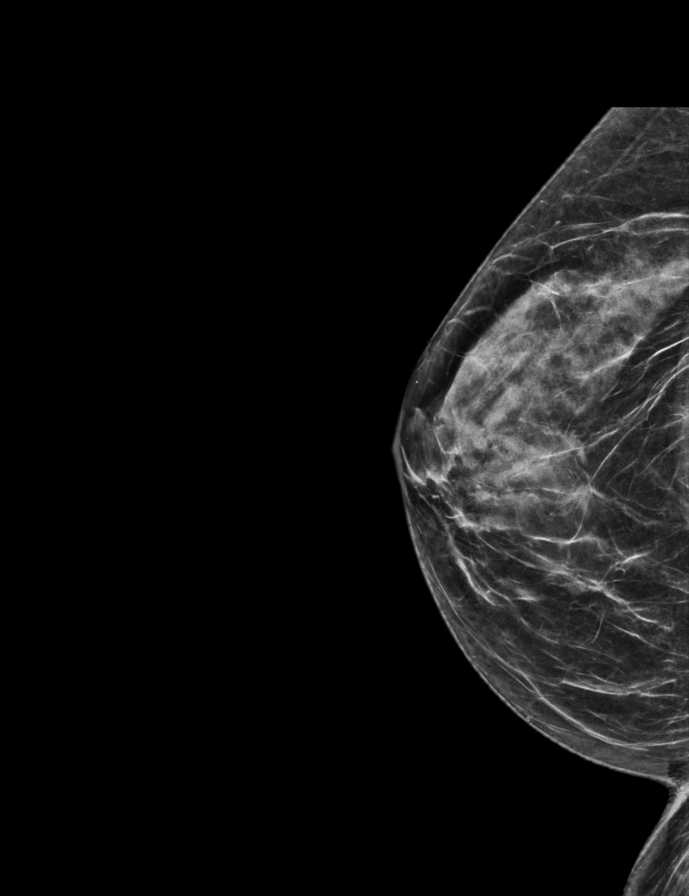

[L CC synth-2D]
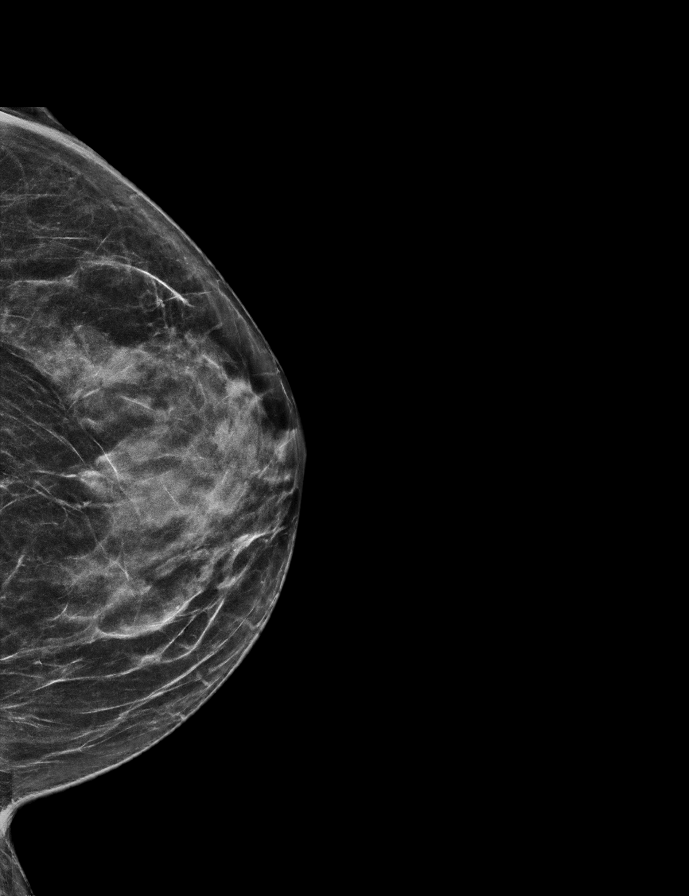

[L MLO synth-2D]
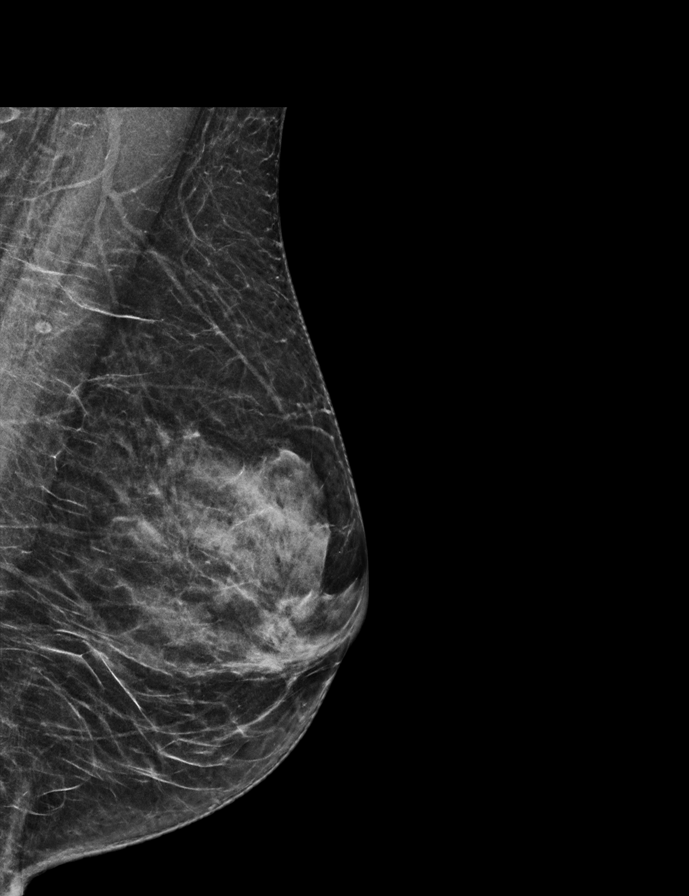

[R MLO synth-2D]
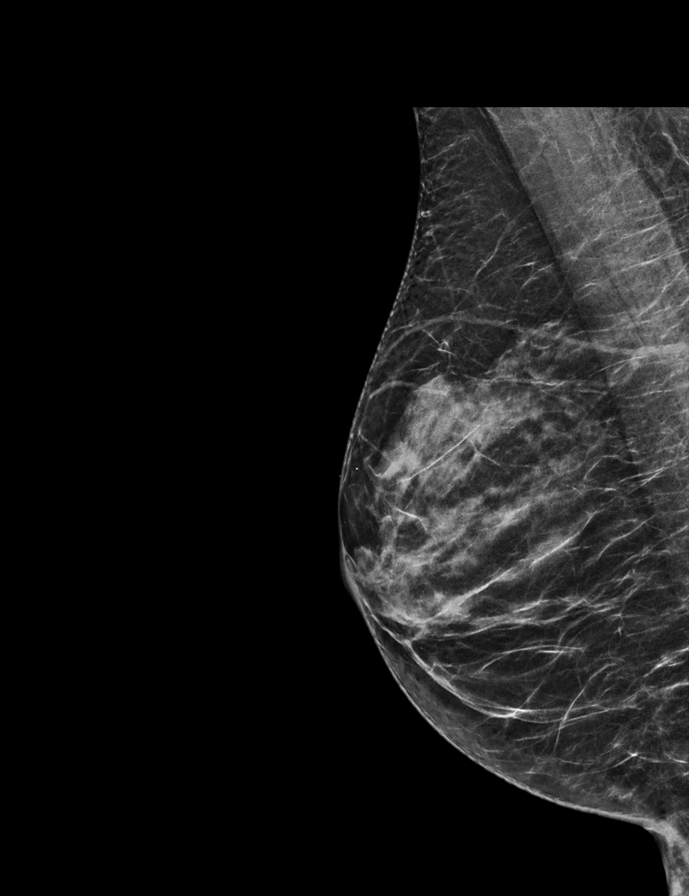

[R CC tomo · 2 of 52 frames shown]
[frame 17/52]
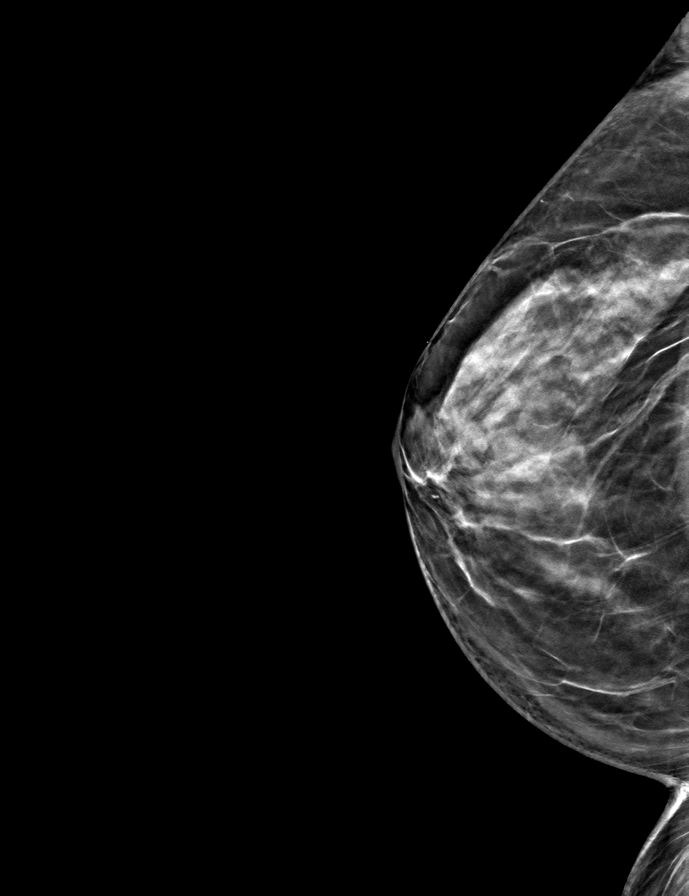
[frame 27/52]
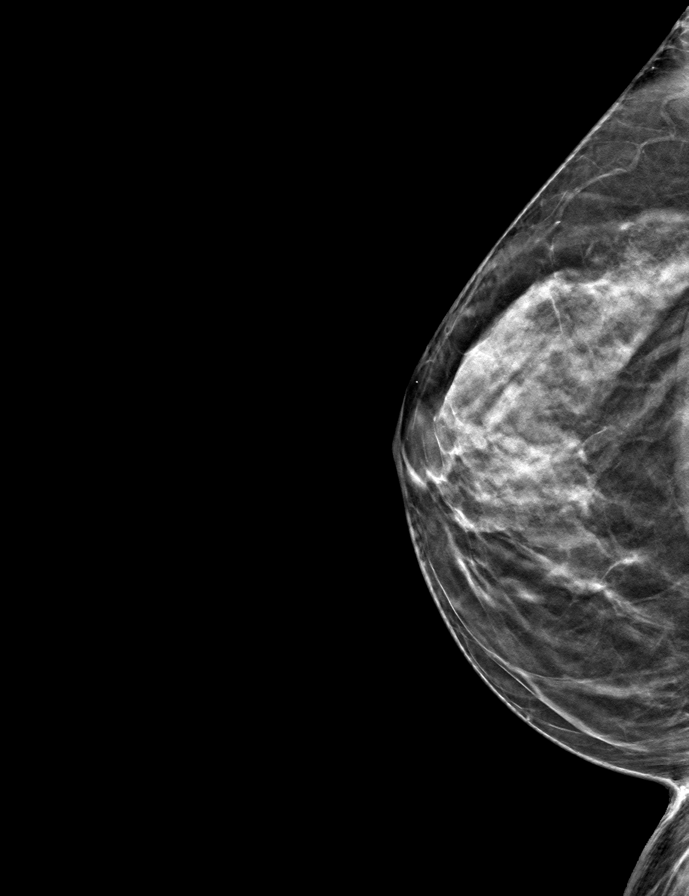

[L CC tomo · tomo slice 29/56.0]
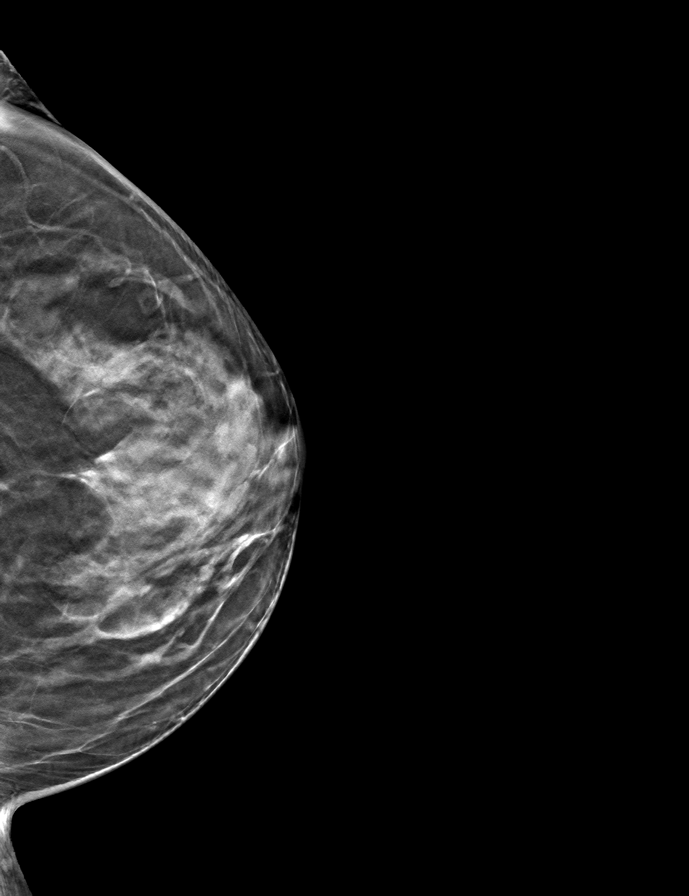

[L MLO tomo · tomo slice 25/50.0]
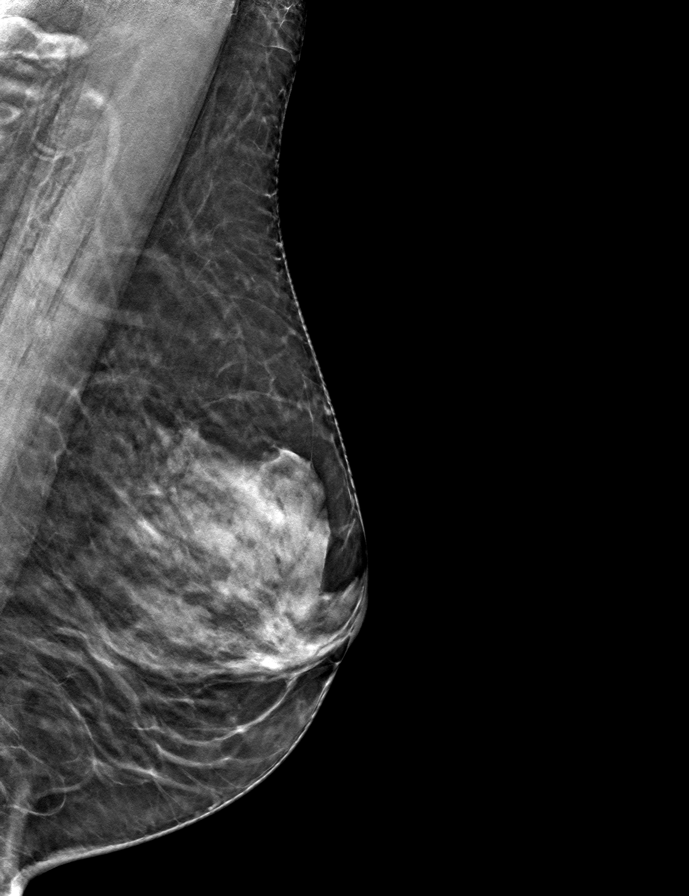

[R MLO tomo · tomo slice 25/48.0]
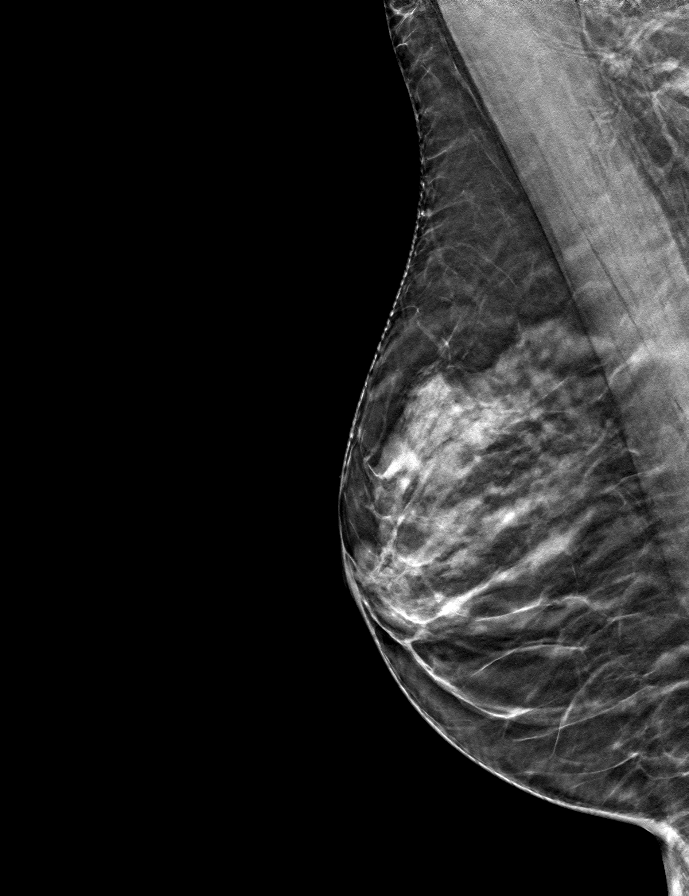

[9 of 24 positions shown; findings below may reference images not displayed]

ACR Breast Density Category c: The breast tissue is heterogeneously
dense, which may obscure small masses.
FINDINGS: There are no findings suspicious for malignancy. Images were
processed with CAD.
IMPRESSION: No mammographic evidence of malignancy. A result letter of this
screening mammogram will be mailed directly to the patient.

RECOMMENDATION:
Screening mammogram in one year. (Code:[id number])

BI-RADS CATEGORY  1: Negative.

## 2021-01-30 ENCOUNTER — Telehealth: Payer: Self-pay | Admitting: Adult Health

## 2021-01-30 NOTE — Telephone Encounter (Signed)
LVM for patient to call back to r/s appt due to [facility] out.

## 2021-02-08 ENCOUNTER — Ambulatory Visit: Payer: Medicare Other

## 2021-02-28 ENCOUNTER — Ambulatory Visit: Payer: Medicare Other | Admitting: Adult Health

## 2021-06-13 ENCOUNTER — Encounter (HOSPITAL_BASED_OUTPATIENT_CLINIC_OR_DEPARTMENT_OTHER): Payer: Self-pay | Admitting: Obstetrics & Gynecology

## 2021-06-13 ENCOUNTER — Other Ambulatory Visit: Payer: Self-pay

## 2021-06-13 ENCOUNTER — Ambulatory Visit (INDEPENDENT_AMBULATORY_CARE_PROVIDER_SITE_OTHER): Payer: Medicare Other | Admitting: Obstetrics & Gynecology

## 2021-06-13 VITALS — BP 135/80 | HR 65 | Ht 65.0 in | Wt 141.8 lb

## 2021-06-13 DIAGNOSIS — N393 Stress incontinence (female) (male): Secondary | ICD-10-CM | POA: Diagnosis not present

## 2021-06-13 DIAGNOSIS — N39498 Other specified urinary incontinence: Secondary | ICD-10-CM | POA: Diagnosis not present

## 2021-06-13 NOTE — Progress Notes (Signed)
GYNECOLOGY  VISIT  CC:   incontinence  HPI: 69 y.o. G2P2 Widowed White or Caucasian female here for complaint of urinary incontinence.   This has been going on about a year.  She has noticed the most with laughing, coughing, going down steps.  What bothers her the most is urinary urgency when her bladder is full.  She does have some double voiding.  Denies vaginal bleeding.   Denies pressure or pelvic pain.  GYNECOLOGIC HISTORY: Patient's last menstrual period was 10/13/2002. Contraception: PMP Menopausal hormone therapy: none   Patient Active Problem List   Diagnosis Date Noted   Sleep choking syndrome 02/28/2020   Loud snoring 02/28/2020   Bilateral sensorineural hearing loss 12/10/2018   Excessive cerumen in right ear canal 12/10/2018   Family history of colonic polyps 11/03/2018   Abnormal TSH 08/10/2017   Hypersomnia, persistent 03/24/2013   UARS (upper airway resistance syndrome) 03/24/2013    Past Medical History:  Diagnosis Date   Erosive esophagitis    on nexium since, with good GERD relief   GERD (gastroesophageal reflux disease)    High cholesterol    Hypothyroid    Miscarriage    G3P2   PE (pulmonary embolism) 1977   secondary falling down stairs with subsequent renal failure   Shingles 2014   Vitamin B12 deficiency    due to small intestine resection, malabsorption , on shots   Vitamin D deficiency     Past Surgical History:  Procedure Laterality Date   [place]   for scar tissue and bowel obstruction.near junction of colon and small bowel per patient   BUNIONECTOMY  2004   scars on both feet   LAPARASCOPY  1978   TUBAL LIGATION      MEDS:   Current Outpatient Medications on File Prior to Visit  Medication Sig Dispense Refill   cholecalciferol (VITAMIN D3) 25 MCG (1000 UT) tablet Take 1,000 Units by mouth daily.     Cyanocobalamin 1000 MCG SUBL Inject as directed. One injection every 2 weeks     sertraline  (ZOLOFT) 25 MG tablet Take 25 mg by mouth 2 (two) times daily.     VYVANSE 20 MG capsule Take 20 mg by mouth daily.  0   zolpidem (AMBIEN) 5 MG tablet Take 2.5 mg by mouth at bedtime. Pt breaks '5mg'$  in half and takes prn  1   No current facility-administered medications on file prior to visit.    ALLERGIES: Codeine, Demerol [meperidine], and Penicillins  Family History  Problem Relation Age of Onset   Transient ischemic attack Mother    Arthritis Mother    Hypertension Mother    Ulcers Mother    COPD Mother    Hypothyroidism Mother    Cancer - Lung Father    Hypertension Father    Diabetes Sister        whooping cough   Diabetes Paternal Aunt    Diabetes Paternal Uncle    Cancer Paternal Grandmother        esophageal   Hypertension Paternal Grandmother    Hypertension Maternal Grandmother    Heart disease Maternal Grandmother    Hypothyroidism Sister    Depression Other        maternal side   Depression Other        paternal side    SH:  widowed, non smoker  Review of Systems  Genitourinary:  Positive for urgency. Negative for frequency, pelvic pain and  vaginal bleeding.   PHYSICAL EXAMINATION:    BP 135/80 (BP Location: Right Arm, Patient Position: Sitting, Cuff Size: Small)   Pulse 65   Ht '5\' 5"'$  (1.651 m)   Wt 141 lb 12.8 oz (64.3 kg)   LMP 10/13/2002   BMI 23.60 kg/m     General appearance: alert, cooperative and appears stated age Abdomen: soft, non-tender; bowel sounds normal; no masses,  no organomegaly Lymph:  no inguinal LAD noted  Pelvic: External genitalia:  no lesions              Urethra:  normal appearing urethra with no masses, tenderness or lesions              Bartholins and Skenes: normal                 Vagina: normal appearing vagina with normal color and discharge, no lesions              Cervix: no lesions              Bimanual Exam:  Uterus:  normal size, contour, position, consistency, mobility, non-tender              Adnexa: no mass,  fullness, tenderness  Chaperone, Octaviano Batty, CMA, was present for exam.  Assessment/Plan: 1. Stress incontinence - d/w pt evaluation and possible treatment options.   - will obtain urine culture to r/o infection - Ambulatory referral to Physical Therapy - may need urogyn referral but will wait to see response to PT

## 2021-06-16 ENCOUNTER — Encounter (HOSPITAL_BASED_OUTPATIENT_CLINIC_OR_DEPARTMENT_OTHER): Payer: Self-pay | Admitting: Obstetrics & Gynecology

## 2021-06-16 DIAGNOSIS — R32 Unspecified urinary incontinence: Secondary | ICD-10-CM | POA: Insufficient documentation

## 2021-06-16 DIAGNOSIS — N393 Stress incontinence (female) (male): Secondary | ICD-10-CM | POA: Insufficient documentation

## 2021-06-18 ENCOUNTER — Telehealth: Payer: Self-pay | Admitting: Neurology

## 2021-06-18 NOTE — Telephone Encounter (Signed)
Received a notifiction that the patient turned her CPAP back into the DME company.   "The following patient Veronica [name] d.o.b  [birth year] has returned her Resmed 11 equipment on 06/03/21 .  Patient states she is not using the machine.  Patient states she has a lot going on in her personal life and she is sleeping better without it.  Patient has been refitted and changed masks but she's still uncomfortable with the machine.  The patient's doctor is [employer]."

## 2021-06-20 ENCOUNTER — Other Ambulatory Visit (HOSPITAL_BASED_OUTPATIENT_CLINIC_OR_DEPARTMENT_OTHER): Payer: Medicare Other

## 2021-06-20 ENCOUNTER — Other Ambulatory Visit (HOSPITAL_BASED_OUTPATIENT_CLINIC_OR_DEPARTMENT_OTHER): Payer: Self-pay

## 2021-06-20 ENCOUNTER — Other Ambulatory Visit: Payer: Self-pay

## 2021-06-20 DIAGNOSIS — N39498 Other specified urinary incontinence: Secondary | ICD-10-CM | POA: Diagnosis not present

## 2021-06-20 NOTE — Progress Notes (Signed)
Patient came in today to do a repeat urine culture. Original sample was never picked up by [facility]. tbw

## 2021-06-22 LAB — URINE CULTURE

## 2021-07-30 DIAGNOSIS — Z23 Encounter for immunization: Secondary | ICD-10-CM | POA: Diagnosis not present

## 2021-07-30 DIAGNOSIS — R232 Flushing: Secondary | ICD-10-CM | POA: Diagnosis not present

## 2021-07-30 DIAGNOSIS — R1319 Other dysphagia: Secondary | ICD-10-CM | POA: Diagnosis not present

## 2021-07-30 DIAGNOSIS — E559 Vitamin D deficiency, unspecified: Secondary | ICD-10-CM | POA: Diagnosis not present

## 2021-07-30 DIAGNOSIS — E538 Deficiency of other specified B group vitamins: Secondary | ICD-10-CM | POA: Diagnosis not present

## 2021-07-30 DIAGNOSIS — F900 Attention-deficit hyperactivity disorder, predominantly inattentive type: Secondary | ICD-10-CM | POA: Diagnosis not present

## 2021-07-30 DIAGNOSIS — E039 Hypothyroidism, unspecified: Secondary | ICD-10-CM | POA: Diagnosis not present

## 2021-07-30 DIAGNOSIS — E785 Hyperlipidemia, unspecified: Secondary | ICD-10-CM | POA: Diagnosis not present

## 2021-07-30 DIAGNOSIS — N39498 Other specified urinary incontinence: Secondary | ICD-10-CM | POA: Diagnosis not present

## 2021-08-22 DIAGNOSIS — Z20822 Contact with and (suspected) exposure to covid-19: Secondary | ICD-10-CM | POA: Diagnosis not present

## 2021-08-27 ENCOUNTER — Other Ambulatory Visit: Payer: Self-pay

## 2021-08-27 ENCOUNTER — Encounter: Payer: Self-pay | Admitting: Physical Therapy

## 2021-08-27 ENCOUNTER — Ambulatory Visit: Payer: Medicare Other | Attending: Obstetrics & Gynecology | Admitting: Physical Therapy

## 2021-08-27 DIAGNOSIS — M6281 Muscle weakness (generalized): Secondary | ICD-10-CM | POA: Insufficient documentation

## 2021-08-27 DIAGNOSIS — R279 Unspecified lack of coordination: Secondary | ICD-10-CM | POA: Diagnosis not present

## 2021-08-27 NOTE — Patient Instructions (Signed)
Bladder Irritants  Certain foods and beverages can be irritating to the bladder.  Avoiding these irritants may decrease your symptoms of urinary urgency, frequency or bladder pain.  Even reducing your intake can help with your symptoms.  Not everyone is sensitive to all bladder irritants, so you may consider focusing on one irritant at a time, removing or reducing your intake of that irritant for 7-10 days to see if this change helps your symptoms.  Water intake is also very important.  Below is a list of bladder irritants.  Drinks: alcohol, carbonated beverages, caffeinated beverages such as coffee and tea, drinks with artificial sweeteners, citrus juices, apple juice, tomato juice  Foods: tomatoes and tomato based foods, spicy food, sugar and artificial sweeteners, vinegar, chocolate, raw onion, apples, citrus fruits, pineapple, cranberries, tomatoes, strawberries, plums, peaches, cantaloupe  Other: acidic urine (too concentrated) - see water intake info below  Substitutes you can try that are NOT irritating to the bladder: cooked onion, pears, papayas, sun-brewed decaf teas, watermelons, non-citrus herbal teas, apricots, kava and low-acid instant drinks (Postum).    WATER INTAKE: Remember to drink lots of water (aim for fluid intake of half your body weight with 2/3 of fluids being water).  You may be limiting fluids due to fear of leakage, but this can actually worsen urgency symptoms due to highly concentrated urine.  Water helps balance the pH of your urine so it doesn't become too acidic - acidic urine is a bladder irritant!  Urge Incontinence  Ideal urination frequency is every 2-4 wakeful hours, which equates to 5-8 times within a 24-hour period.   Urge incontinence is leakage that occurs when the bladder muscle contracts, creating a sudden need to go before getting to the bathroom.   Going too often when your bladder isn't actually full can disrupt the body's automatic signals to  store and hold urine longer, which will increase urgency/frequency.  In this case, the bladder "is running the show" and strategies can be learned to retrain this pattern.   One should be able to control the first urge to urinate, at around 150mL.  The bladder can hold up to a "grande latte," or 400mL. To help you gain control, practice the Urge Drill below when urgency strikes.  This drill will help retrain your bladder signals and allow you to store and hold urine longer.  The overall goal is to stretch out your time between voids to reach a more manageable voiding schedule.    Practice your "quick flicks" often throughout the day (each waking hour) even when you don't need feel the urge to go.  This will help strengthen your pelvic floor muscles, making them more effective in controlling leakage.  Urge Drill  When you feel an urge to go, follow these steps to regain control: Stop what you are doing and be still Take one deep breath, directing your air into your abdomen Think an affirming thought, such as "I've got this." Do 5 quick flicks of your pelvic floor Walk with control to the bathroom to void, or delay voiding   Relaxation Exercises with the Urge to Void   When you experience an urge to void:  FIRST  Stop and stand very still    Sit down if you can    Don't move    You need to stay very still to maintain control  SECOND Squeeze your pelvic floor muscles 5 times, like a quick flick, to keep from leaking  THIRD Relax  Take   a deep breath and then let it out  Try to make the urge go away by using relaxation and visualization techniques  FINALLY When you feel the urge go away somewhat, walk normally to the bathroom.   If the urge gets suddenly stronger on the way, you may stop again and relax to regain control.    

## 2021-08-27 NOTE — Therapy (Signed)
[facility] @ [address] [place], [place], [address] Phone: [phone]   Fax:  [phone]  Physical Therapy Evaluation  Patient Details  Name: Veronica [name] MRN: [id number] Date of Birth: 08/27/1951 Referring Provider (PT): Megan Salon, MD   Encounter Date: 08/27/2021   PT End of Session - 08/27/21 1651     Visit Number 1    Date for PT Re-Evaluation 11/27/21    Authorization Type MCR    Progress Note Due on Visit 10    PT Start Time 1620    PT Stop Time 1648   pt denied internal assessment or additional questions at end of assessment   PT Time Calculation (min) 28 min    Activity Tolerance Patient tolerated treatment well    Behavior During Therapy [facility] for tasks assessed/performed             Past Medical History:  Diagnosis Date   Erosive esophagitis    on nexium since, with good GERD relief   GERD (gastroesophageal reflux disease)    High cholesterol    Hypothyroid    Miscarriage    G3P2   PE (pulmonary embolism) 1977   secondary falling down stairs with subsequent renal failure   Shingles 2014   Vitamin B12 deficiency    due to small intestine resection, malabsorption , on shots   Vitamin D deficiency     Past Surgical History:  Procedure Laterality Date   [place]   for scar tissue and bowel obstruction.near junction of colon and small bowel per patient   BUNIONECTOMY  2004   scars on both feet   LAPARASCOPY  1978   TUBAL LIGATION      There were no vitals filed for this visit.    Subjective Assessment - 08/27/21 1624     Subjective Pt reports 6 month h/o urinary incontinence with strong urge, sneezing, jumping, sudden movements, laughing.    Pertinent History 2 vaginal birth with tearing with both but more so with second with several stitches    How long can you sit comfortably? no limits    How long can you stand comfortably? no limits    How  long can you walk comfortably? no limits    Patient Stated Goals to have less leakage    Currently in Pain? No/denies                [facility] PT Assessment - 08/27/21 0001       Assessment   Medical Diagnosis N39.3 (ICD-10-CM) - Stress incontinence    Referring Provider (PT) Megan Salon, MD    Onset Date/Surgical Date --   6 months   Prior Therapy 70years ago, back      Precautions   Precautions None      Restrictions   Weight Bearing Restrictions No      Balance Screen   Has the patient fallen in the past 6 months No    Has the patient had a decrease in activity level because of a fear of falling?  No    Is the patient reluctant to leave their home because of a fear of falling?  No      Prior Function   Level of Independence Independent    Vocation Retired    [employer], read, walking      Cognition   Overall Cognitive Status Within Functional Limits for tasks assessed  Observation/Other Assessments   Scoliosis curving to Rt with Rt rib hump and Lt iliac crest higher than Rt.      Sensation   Light Touch Impaired by gross assessment   mild numbness in fingers when waking but goes away     Coordination   Gross Motor Movements are Fluid and Coordinated Yes    Fine Motor Movements are Fluid and Coordinated Yes      Posture/Postural Control   Posture/Postural Control No significant limitations      ROM / Strength   AROM / PROM / Strength AROM;Strength      AROM   Overall AROM Comments WFL      Strength   Overall Strength Comments 4+5 in all directions at bil hips      Flexibility   Soft Tissue Assessment /Muscle Length yes   WFL     Palpation   Palpation comment no TTP at abdomen, pubic bone, or bil LE                        Objective measurements completed on examination: See above findings.     Pelvic Floor Special Questions - 08/27/21 0001     Prior Pelvic/Prostate Exam Yes   normal   Are you Pregnant or attempting  pregnancy? No    Prior Pregnancies Yes    Number of Pregnancies 2    Number of Vaginal Deliveries 2    Any difficulty with labor and deliveries No    Episiotomy Performed No    Currently Sexually Active Yes    Is this Painful No   no dryness or irritation   History of sexually transmitted disease No    Marinoff Scale no problems    Urinary Leakage Yes    How often daily, at least once    Pad use one pad    Activities that cause leaking Coughing;With strong urge;Sneezing;Laughing;Exercising    Urinary urgency Yes    Urinary frequency usually urinates ~3x per day and once per day    Fecal incontinence No   various types of stool one BM per day, had a bowel resection after first child and has had this since then.   Fluid intake no where close enough per pt    Caffeine beverages 2-8cups coffee in AM, no sodas    Falling out feeling (prolapse) No    External Perineal Exam pt deferred    Pelvic Floor Internal Exam deferred                       PT Education - 08/27/21 1651     Education Details pt educated on exam findings, POC, urge drill and bladder irritants.    Person(s) Educated Patient    Methods Explanation;Demonstration;Tactile cues;Verbal cues;Handout    Comprehension Verbalized understanding;Returned demonstration              PT Short Term Goals - 08/27/21 1703       PT SHORT TERM GOAL #1   Title pt to be I with HEP    Time 4    Period Weeks    Status New    Target Date 09/24/21      PT SHORT TERM GOAL #2   Title pt to demonstrate at least 3/5 pelvic floor strength to decrease urinary leakages.    Time 4    Period Weeks    Status New    Target Date 09/24/21  PT SHORT TERM GOAL #3   Title pt to report no more than one leak per day to improve QOL.    Time 4    Period Weeks    Status New    Target Date 09/24/21               PT Long Term Goals - 08/27/21 1703       PT LONG TERM GOAL #1   Title pt to be I with advanced HEP     Time 3    Period Months    Status New    Target Date 11/27/21      PT LONG TERM GOAL #2   Title pt to demonstrate at least 4/5 pelvic floor strength to decrease urinary leakages.    Time 3    Period Months    Status New    Target Date 11/27/21      PT LONG TERM GOAL #3   Title pt to report no more than one leak per week to improve QOL.    Time 3    Period Months    Status New    Target Date 11/27/21                    Plan - 08/27/21 1652     Clinical Impression Statement Pt is 70yo female presenting to clinic with 19month h/o urinary leakage with stressors of urgency, laughing, coughing, sneezing and exercises. Pt reports she wears one pad per day and doesn't need to change it but does feel she leaks at least once per day. Pt denies any pain in general or with intercourse, no dryness or irritation or other concerns other than leakage. Does have 1-2 BMs per day usually formed, no pain, fully empties, and denies issues with constipation. Pt reports she does drink up to 3 large cups of coffee daily and doesn't drink a lot water, usually urinates 3x per day and once per night. Pt deferred internal assessment and requested to see if bladder irritants, urge drill, and bladder retraining helped without needing to do internal at this time. Pt educated on all of this, found to have mild decreased mobility in spine and mild weakness in hips. Pt would benefit from additional PT to address and assess pelvic floor deficits and urinary leakage.    Personal Factors and Comorbidities Comorbidity 1    Comorbidities 2 vaginal births    Examination-Activity Limitations Continence;Hygiene/Grooming;Toileting    Examination-Participation Restrictions Community Activity    Stability/Clinical Decision Making Stable/Uncomplicated    Clinical Decision Making Low    Rehab Potential Good    PT Frequency 1x / week    PT Duration 6 weeks             Patient will benefit from skilled therapeutic  intervention in order to improve the following deficits and impairments:  Decreased strength, Impaired flexibility, Improper body mechanics, Decreased endurance, Decreased coordination  Visit Diagnosis: Muscle weakness (generalized) - Plan: PT plan of care cert/re-cert  Lack of coordination - Plan: PT plan of care cert/re-cert     Problem List Patient Active Problem List   Diagnosis Date Noted   Stress incontinence 06/16/2021   Sleep choking syndrome 02/28/2020   Loud snoring 02/28/2020   Bilateral sensorineural hearing loss 12/10/2018   Excessive cerumen in right ear canal 12/10/2018   Family history of colonic polyps 11/03/2018   Abnormal TSH 08/10/2017   Hypersomnia, persistent 03/24/2013   UARS (upper airway resistance syndrome)  03/24/2013    Stacy Gardner, PT, DPT 11/15/225:05 PM   [facility] @ [address] [place], [place], [address] Phone: [phone]   Fax:  [phone]  Name: Veronica [name] MRN: [id number] Date of Birth: [birth year]

## 2021-09-13 ENCOUNTER — Ambulatory Visit (HOSPITAL_BASED_OUTPATIENT_CLINIC_OR_DEPARTMENT_OTHER): Payer: Medicare Other | Admitting: Obstetrics & Gynecology

## 2021-09-17 ENCOUNTER — Ambulatory Visit: Payer: Medicare Other | Attending: Obstetrics & Gynecology | Admitting: Physical Therapy

## 2021-09-17 ENCOUNTER — Other Ambulatory Visit: Payer: Self-pay

## 2021-09-17 DIAGNOSIS — M6281 Muscle weakness (generalized): Secondary | ICD-10-CM | POA: Diagnosis not present

## 2021-09-17 DIAGNOSIS — R279 Unspecified lack of coordination: Secondary | ICD-10-CM | POA: Insufficient documentation

## 2021-09-17 NOTE — Therapy (Signed)
[facility] @ [address] [place], [place], [address] Phone: [phone]   Fax:  [phone]  Physical Therapy Treatment  Patient Details  Name: Veronica [name] MRN: [id number] Date of Birth: [birth year] Referring Provider (PT): Megan Salon, MD   Encounter Date: 09/17/2021   PT End of Session - 09/17/21 1439     Visit Number 2    Date for PT Re-Evaluation 11/27/21    Authorization Type MCR    Progress Note Due on Visit 10    PT Start Time 1400    PT Stop Time 1438    PT Time Calculation (min) 38 min    Activity Tolerance Patient tolerated treatment well    Behavior During Therapy [facility] for tasks assessed/performed             Past Medical History:  Diagnosis Date   Erosive esophagitis    on nexium since, with good GERD relief   GERD (gastroesophageal reflux disease)    High cholesterol    Hypothyroid    Miscarriage    G3P2   PE (pulmonary embolism) 1977   secondary falling down stairs with subsequent renal failure   Shingles 2014   Vitamin B12 deficiency    due to small intestine resection, malabsorption , on shots   Vitamin D deficiency     Past Surgical History:  Procedure Laterality Date   [place]   for scar tissue and bowel obstruction.near junction of colon and small bowel per patient   BUNIONECTOMY  2004   scars on both feet   LAPARASCOPY  1978   TUBAL LIGATION      There were no vitals filed for this visit.   Subjective Assessment - 09/17/21 1404     Subjective Pt reports greatly improved leakage now only having very small amounts with sneezing mostly, has been completing urge drill which helps. Pt does still have urgency but has gotten better.    Pertinent History 2 vaginal birth with tearing with both but more so with second with several stitches    How long can you sit comfortably? no limits    How long can you stand comfortably? no limits    How long  can you walk comfortably? no limits    Patient Stated Goals to have less leakage    Currently in Pain? No/denies                   No emotional/communication barriers or cognitive limitation. Patient is motivated to learn. Patient understands and agrees with treatment goals and plan. PT explains patient will be examined in standing, sitting, and lying down to see how their muscles and joints work. When they are ready, they will be asked to remove their underwear so PT can examine their perineum. The patient is also given the option of providing their own chaperone as one is not provided in our facility. The patient also has the right and is explained the right to defer or refuse any part of the evaluation or treatment including the internal exam. With the patient's consent, PT will use one gloved finger to gently assess the muscles of the pelvic floor, seeing how well it contracts and relaxes and if there is muscle symmetry. After, the patient will get dressed and PT and patient will discuss exam findings and plan of care. PT and patient discuss plan of care, schedule, attendance policy and HEP activities.  Pelvic Floor Special Questions - 09/17/21 0001     Pelvic Floor Internal Exam patient identified and patient confirms consent for PT to perform internal soft tissue work and muscle strength and integrity assessment    Exam Type Vaginal    Sensation WFL    Palpation no TTP    Strength fair squeeze, definite lift    Strength # of reps 5    Strength # of seconds 4    Tone decreased slightly               [facility] Adult PT Treatment/Exercise - 09/17/21 0001       Self-Care   Self-Care Other Self-Care Comments    Other Self-Care Comments  pt educated on vaginal weights, HEP for kegels, and diaphragmatic breathing      Neuro Re-ed    Neuro Re-ed Details  pt directed in diaphragmatic breathing with verbal and tactile cues and added in pelvic floor contraction and  relaxation. Pt demonstrated great difficulty completing this and needed multiple attempts and cues to complete 1-2 reps correctly. pt also benefited from [facility] for x5 pelvic contractions to complete without holding breathing, and to prevent bulging downward with attempts to contract and hold. noted belly bulge with this attepmt as well and needed max cues to activate core.                     PT Education - 09/17/21 1439     Education Details Pt educated on breathing mechanics, kegel HEP, and vaginal weights    Person(s) Educated Patient    Methods Explanation;Demonstration;Tactile cues;Verbal cues;Handout    Comprehension Verbalized understanding;Returned demonstration              PT Short Term Goals - 08/27/21 1703       PT SHORT TERM GOAL #1   Title pt to be I with HEP    Time 4    Period Weeks    Status New    Target Date 09/24/21      PT SHORT TERM GOAL #2   Title pt to demonstrate at least 3/5 pelvic floor strength to decrease urinary leakages.    Time 4    Period Weeks    Status New    Target Date 09/24/21      PT SHORT TERM GOAL #3   Title pt to report no more than one leak per day to improve QOL.    Time 4    Period Weeks    Status New    Target Date 09/24/21               PT Long Term Goals - 08/27/21 1703       PT LONG TERM GOAL #1   Title pt to be I with advanced HEP    Time 3    Period Months    Status New    Target Date 11/27/21      PT LONG TERM GOAL #2   Title pt to demonstrate at least 4/5 pelvic floor strength to decrease urinary leakages.    Time 3    Period Months    Status New    Target Date 11/27/21      PT LONG TERM GOAL #3   Title pt to report no more than one leak per week to improve QOL.    Time 3    Period Months    Status New    Target Date 11/27/21  Plan - 09/17/21 1440     Clinical Impression Statement Pt presenting to clinic reporting she has seen improvement with urinary  leakage frequency and now able to complete urge drill to limit leakage as well. Pt reports she does still have leakage with stressors of sneezing laughing and coughing but has been improving and does still have urgency but less leakage with it. Pt session focused on education of the knack method, vaginal weights, breathing mechanics and kegel HEP given. pt consented to internal assessment this date, found to have 3/5 strength at pelvic floor, decreased coordination and endurance as well with increased time to complete quick flicks, and 4s holds. Pt denied additional questions at end of session and verbalized understanding of education for home.Pt would benefit from additional PT to address and assess pelvic floor deficits and urinary leakage.    Personal Factors and Comorbidities Comorbidity 1    Comorbidities 2 vaginal births    Examination-Activity Limitations Continence;Hygiene/Grooming;Toileting    Examination-Participation Restrictions Community Activity    Stability/Clinical Decision Making Stable/Uncomplicated    Rehab Potential Good    PT Frequency 1x / week    PT Duration 6 weeks    PT Next Visit Plan go over bladder retraining, pelvic strengthening and breathing mechanics with activity    PT Home Exercise Plan kegel HEP, breathing mechanics.             Patient will benefit from skilled therapeutic intervention in order to improve the following deficits and impairments:  Decreased strength, Impaired flexibility, Improper body mechanics, Decreased endurance, Decreased coordination  Visit Diagnosis: Muscle weakness (generalized)  Lack of coordination     Problem List Patient Active Problem List   Diagnosis Date Noted   Stress incontinence 06/16/2021   Sleep choking syndrome 02/28/2020   Loud snoring 02/28/2020   Bilateral sensorineural hearing loss 12/10/2018   Excessive cerumen in right ear canal 12/10/2018   Family history of colonic polyps 11/03/2018   Abnormal TSH  08/10/2017   Hypersomnia, persistent 03/24/2013   UARS (upper airway resistance syndrome) 03/24/2013   Stacy Gardner, PT, DPT 12/06/222:44 PM   [facility] @ [address] [place], [place], [address] Phone: [phone]   Fax:  [phone]  Name: Veronica [name] MRN: [id number] Date of Birth: 06-19-1951

## 2021-09-17 NOTE — Patient Instructions (Signed)
Pelvic Floor Strengthening:  Do 3x10 Kegels throughout the day  Do 7G90 Quick Flicks throughout the day (contract and relax quickly back to back) Do 3x10 contractions with a 5 second hold each time *You can put a hand on your abdomen and one on your bottom when doing these. There shouldn't be large movements here when trying to do Kegels. Kegels should be isolated to the pelvic floor *Do one round of 10 at morning, lunch, and evening.

## 2021-09-25 ENCOUNTER — Encounter: Payer: Medicare Other | Admitting: Physical Therapy

## 2021-10-02 ENCOUNTER — Other Ambulatory Visit: Payer: Self-pay

## 2021-10-02 ENCOUNTER — Ambulatory Visit: Payer: Medicare Other | Admitting: Physical Therapy

## 2021-10-02 DIAGNOSIS — R279 Unspecified lack of coordination: Secondary | ICD-10-CM

## 2021-10-02 DIAGNOSIS — M6281 Muscle weakness (generalized): Secondary | ICD-10-CM | POA: Diagnosis not present

## 2021-10-02 NOTE — Therapy (Signed)
[facility] @ [address] [place], [place], [address] Phone: [phone]   Fax:  [phone]  Physical Therapy Treatment  Patient Details  Name: Veronica [name] MRN: [id number] Date of Birth: [birth year] Referring Provider (PT): Megan Salon, MD   Encounter Date: 10/02/2021   PT End of Session - 10/02/21 1409     Visit Number 3    Date for PT Re-Evaluation 11/27/21    Authorization Type MCR    Progress Note Due on Visit 10    PT Start Time 1400    PT Stop Time 1440    PT Time Calculation (min) 40 min    Activity Tolerance Patient tolerated treatment well    Behavior During Therapy [facility] for tasks assessed/performed             Past Medical History:  Diagnosis Date   Erosive esophagitis    on nexium since, with good GERD relief   GERD (gastroesophageal reflux disease)    High cholesterol    Hypothyroid    Miscarriage    G3P2   PE (pulmonary embolism) 1977   secondary falling down stairs with subsequent renal failure   Shingles 2014   Vitamin B12 deficiency    due to small intestine resection, malabsorption , on shots   Vitamin D deficiency     Past Surgical History:  Procedure Laterality Date   [place]   for scar tissue and bowel obstruction.near junction of colon and small bowel per patient   BUNIONECTOMY  2004   scars on both feet   LAPARASCOPY  1978   TUBAL LIGATION      There were no vitals filed for this visit.   Subjective Assessment - 10/02/21 1400     Subjective Pt reports she doesn't think she has had any changes since last visit as she reports she hasn't been able to do exercises preping for the holidays.    Pertinent History 2 vaginal birth with tearing with both but more so with second with several stitches    How long can you sit comfortably? no limits    How long can you stand comfortably? no limits    How long can you walk comfortably? no limits     Patient Stated Goals to have less leakage    Currently in Pain? No/denies                               [facility] Adult PT Treatment/Exercise - 10/02/21 0001       Exercises   Exercises Lumbar;Knee/Hip      Lumbar Exercises: Stretches   Other Lumbar Stretch Exercise hip flexor stretch x45 half kneel      Lumbar Exercises: Standing   Functional Squats 10 reps    Functional Squats Limitations 2x 3# DB    Other Standing Lumbar Exercises palloffs red band 2x10 each;rotation palloffs red band 2x10 each    Other Standing Lumbar Exercises mario punches x10 with x2 3# DB      Lumbar Exercises: Supine   Clam 20 reps    Clam Limitations red loop with kegel and exhale    Bridge 20 reps    Bridge Limitations 2x10 with exhale and kegal    Other Supine Lumbar Exercises opp arm/knee press with exhale 2x10      Lumbar Exercises: Quadruped   Opposite Arm/Leg Raise  Right arm/Left leg;Left arm/Right leg;10 reps    Opposite Arm/Leg Raise Limitations with exhale    Other Quadruped Lumbar Exercises knee hovers x10 each with ball unilaterally                       PT Short Term Goals - 08/27/21 1703       PT SHORT TERM GOAL #1   Title pt to be I with HEP    Time 4    Period Weeks    Status New    Target Date 09/24/21      PT SHORT TERM GOAL #2   Title pt to demonstrate at least 3/5 pelvic floor strength to decrease urinary leakages.    Time 4    Period Weeks    Status New    Target Date 09/24/21      PT SHORT TERM GOAL #3   Title pt to report no more than one leak per day to improve QOL.    Time 4    Period Weeks    Status New    Target Date 09/24/21               PT Long Term Goals - 08/27/21 1703       PT LONG TERM GOAL #1   Title pt to be I with advanced HEP    Time 3    Period Months    Status New    Target Date 11/27/21      PT LONG TERM GOAL #2   Title pt to demonstrate at least 4/5 pelvic floor strength to decrease urinary  leakages.    Time 3    Period Months    Status New    Target Date 11/27/21      PT LONG TERM GOAL #3   Title pt to report no more than one leak per week to improve QOL.    Time 3    Period Months    Status New    Target Date 11/27/21                   Plan - 10/02/21 1410     Clinical Impression Statement Pt reports she hasn't seen a difference since last visit but reports she hasn't done exercises as she has been preping for the holidays. Pt session focused on strengthening at hips and core with pelvic floor and breathing coordination for decreased strain at pelvic floor. PT required cues for coordination and not to hold breath with extra time to complete but able to these.  Pt would benefit from additional PT to address and assess pelvic floor deficits and urinary leakage.    Personal Factors and Comorbidities Comorbidity 1    Comorbidities 2 vaginal births    Examination-Activity Limitations Continence;Hygiene/Grooming;Toileting    Examination-Participation Restrictions Community Activity    Stability/Clinical Decision Making Stable/Uncomplicated    Rehab Potential Good    PT Frequency 1x / week    PT Duration 6 weeks    PT Treatment/Interventions Functional mobility training;Therapeutic activities;Therapeutic exercise;Neuromuscular re-education;Manual techniques;Patient/family education;Passive range of motion;Energy conservation    PT Next Visit Plan pelvic strengthening and breathing mechanics with activity    PT Home Exercise Plan kegel HEP, breathing mechanics.    Consulted and Agree with Plan of Care Patient             Patient will benefit from skilled therapeutic intervention in order to improve the following deficits and impairments:  Decreased strength, Impaired flexibility, Improper body mechanics, Decreased endurance, Decreased coordination  Visit Diagnosis: Muscle weakness (generalized)  Lack of coordination     Problem List Patient Active  Problem List   Diagnosis Date Noted   Stress incontinence 06/16/2021   Sleep choking syndrome 02/28/2020   Loud snoring 02/28/2020   Bilateral sensorineural hearing loss 12/10/2018   Excessive cerumen in right ear canal 12/10/2018   Family history of colonic polyps 11/03/2018   Abnormal TSH 08/10/2017   Hypersomnia, persistent 03/24/2013   UARS (upper airway resistance syndrome) 03/24/2013    Junie Panning, PT 10/02/2021, 2:41 PM  [facility] @ [place], [place], [address] Phone: [phone]   Fax:  [phone]  Name: Veronica [name] MRN: [id number] Date of Birth: 1950-11-12

## 2021-10-16 ENCOUNTER — Encounter: Payer: Medicare Other | Admitting: Physical Therapy

## 2021-10-16 DIAGNOSIS — H02729 Madarosis of unspecified eye, unspecified eyelid and periocular area: Secondary | ICD-10-CM | POA: Diagnosis not present

## 2021-10-16 DIAGNOSIS — L821 Other seborrheic keratosis: Secondary | ICD-10-CM | POA: Diagnosis not present

## 2021-10-16 DIAGNOSIS — L570 Actinic keratosis: Secondary | ICD-10-CM | POA: Diagnosis not present

## 2021-10-16 DIAGNOSIS — L603 Nail dystrophy: Secondary | ICD-10-CM | POA: Diagnosis not present

## 2021-10-16 DIAGNOSIS — L814 Other melanin hyperpigmentation: Secondary | ICD-10-CM | POA: Diagnosis not present

## 2021-10-17 DIAGNOSIS — Z20822 Contact with and (suspected) exposure to covid-19: Secondary | ICD-10-CM | POA: Diagnosis not present

## 2021-10-18 DIAGNOSIS — U071 COVID-19: Secondary | ICD-10-CM | POA: Diagnosis not present

## 2021-10-23 ENCOUNTER — Other Ambulatory Visit: Payer: Self-pay

## 2021-10-23 ENCOUNTER — Ambulatory Visit: Payer: Medicare Other | Attending: Obstetrics & Gynecology | Admitting: Physical Therapy

## 2021-10-23 DIAGNOSIS — M6281 Muscle weakness (generalized): Secondary | ICD-10-CM | POA: Diagnosis not present

## 2021-10-23 DIAGNOSIS — R279 Unspecified lack of coordination: Secondary | ICD-10-CM | POA: Insufficient documentation

## 2021-10-23 NOTE — Therapy (Signed)
[place] @ [place], [place], [address] Phone: [phone]   Fax:  [phone]  Physical Therapy Treatment  Patient Details  Name: Veronica [name] MRN: [id number] Date of Birth: Nov 06, 1950 Referring Provider (PT): Megan Salon, MD   Encounter Date: 10/23/2021   PT End of Session - 10/23/21 1525     Visit Number 4    Date for PT Re-Evaluation 11/27/21    Authorization Type MCR    Progress Note Due on Visit 10    PT Start Time 1450   pt arrival time   PT Stop Time 1525    PT Time Calculation (min) 35 min    Activity Tolerance Patient tolerated treatment well    Behavior During Therapy [facility] for tasks assessed/performed             Past Medical History:  Diagnosis Date   Erosive esophagitis    on nexium since, with good GERD relief   GERD (gastroesophageal reflux disease)    High cholesterol    Hypothyroid    Miscarriage    G3P2   PE (pulmonary embolism) 1977   secondary falling down stairs with subsequent renal failure   Shingles 2014   Vitamin B12 deficiency    due to small intestine resection, malabsorption , on shots   Vitamin D deficiency     Past Surgical History:  Procedure Laterality Date   [place]   for scar tissue and bowel obstruction.near junction of colon and small bowel per patient   BUNIONECTOMY  2004   scars on both feet   LAPARASCOPY  1978   TUBAL LIGATION      There were no vitals filed for this visit.   Subjective Assessment - 10/23/21 1454     Subjective Pt reports "I have seen real improvements", sneezing no longer an issue, no leakage at all.    Pertinent History 2 vaginal birth with tearing with both but more so with second with several stitches    How long can you sit comfortably? no limits    How long can you stand comfortably? no limits    How long can you walk comfortably? no limits    Currently in Pain? No/denies                                [facility] Adult PT Treatment/Exercise - 10/23/21 0001       Exercises   Exercises Lumbar;Knee/Hip      Lumbar Exercises: Standing   Functional Squats 20 reps    Functional Squats Limitations 10# kettle bell; x10 reps with lifting teal bolster from ground level with coordination of breathing mechanics, pelvic floor contraction/relaxation and proper lifting mechanics    Other Standing Lumbar Exercises palloffs green band 2x10 each      Lumbar Exercises: Supine   Clam 20 reps    Clam Limitations black loop with kegel and exhale    Bridge 20 reps    Bridge Limitations 2x10 with exhale and kegal    Other Supine Lumbar Exercises hip flexion black loop x10    Other Supine Lumbar Exercises opp arm/knee press with exhale 2x10                     PT Education - 10/23/21 1517     Education Details Pt educated on coordination with  breathing mechanics for all exercises and cueing for pelvic floor coordination as well    Person(s) Educated Patient    Methods Explanation;Demonstration;Tactile cues;Verbal cues    Comprehension Returned demonstration;Verbalized understanding              PT Short Term Goals - 10/23/21 1529       PT SHORT TERM GOAL #1   Title pt to be I with HEP    Time 4    Period Weeks    Status Achieved    Target Date 09/24/21      PT SHORT TERM GOAL #2   Title pt to demonstrate at least 3/5 pelvic floor strength to decrease urinary leakages.    Time 4    Period Weeks    Status Partially Met    Target Date 09/24/21      PT SHORT TERM GOAL #3   Title pt to report no more than one leak per day to improve QOL.    Time 4    Period Weeks    Status Achieved    Target Date 09/24/21               PT Long Term Goals - 08/27/21 1703       PT LONG TERM GOAL #1   Title pt to be I with advanced HEP    Time 3    Period Months    Status New    Target Date 11/27/21      PT LONG TERM GOAL #2   Title pt to  demonstrate at least 4/5 pelvic floor strength to decrease urinary leakages.    Time 3    Period Months    Status New    Target Date 11/27/21      PT LONG TERM GOAL #3   Title pt to report no more than one leak per week to improve QOL.    Time 3    Period Months    Status New    Target Date 11/27/21                   Plan - 10/23/21 1525     Clinical Impression Statement Pt presents to clinic reporting she has seen almost complete resolution of symptoms with no leakage at all in the past 3 weeks at least and understanding now how to coordinate breathing and core activation with exercising does still need extra time to incorperate all components of activity with core, breathing, and pelvic floor but able with concentration. Pt session focused on strengthening with increased resistance with band work and increased weight with squats. Pt tolerated well with minimal cues and demonstrated ability to complete exercises with all components during treatment with these cues. Pt would benefit from additional PT to address and assess pelvic floor deficits and urinary leakage.    Personal Factors and Comorbidities Comorbidity 1    Comorbidities 2 vaginal births    Examination-Activity Limitations Continence;Hygiene/Grooming;Toileting    Examination-Participation Restrictions Community Activity    Stability/Clinical Decision Making Stable/Uncomplicated    Rehab Potential Good    PT Frequency 1x / week    PT Duration 6 weeks    PT Treatment/Interventions Functional mobility training;Therapeutic activities;Therapeutic exercise;Neuromuscular re-education;Manual techniques;Patient/family education;Passive range of motion;Energy conservation    PT Next Visit Plan pelvic strengthening and breathing mechanics with activity    PT Home Exercise Plan kegel HEP, breathing mechanics.    Consulted and Agree with Plan of Care Patient  Patient will benefit from skilled therapeutic  intervention in order to improve the following deficits and impairments:  Decreased strength, Impaired flexibility, Improper body mechanics, Decreased endurance, Decreased coordination  Visit Diagnosis: Muscle weakness (generalized)  Lack of coordination     Problem List Patient Active Problem List   Diagnosis Date Noted   Stress incontinence 06/16/2021   Sleep choking syndrome 02/28/2020   Loud snoring 02/28/2020   Bilateral sensorineural hearing loss 12/10/2018   Excessive cerumen in right ear canal 12/10/2018   Family history of colonic polyps 11/03/2018   Abnormal TSH 08/10/2017   Hypersomnia, persistent 03/24/2013   UARS (upper airway resistance syndrome) 03/24/2013    Stacy Gardner, PT, DPT 01/11/233:29 PM   [facility] @ [address], [place], [address] Phone: [phone]   Fax:  [phone]  Name: Veronica [name] MRN: [id number] Date of Birth: April 17, 1951

## 2021-10-29 ENCOUNTER — Encounter: Payer: Self-pay | Admitting: Physical Therapy

## 2021-10-29 ENCOUNTER — Other Ambulatory Visit: Payer: Self-pay

## 2021-10-29 ENCOUNTER — Ambulatory Visit: Payer: Medicare Other | Admitting: Physical Therapy

## 2021-10-29 DIAGNOSIS — M6281 Muscle weakness (generalized): Secondary | ICD-10-CM | POA: Diagnosis not present

## 2021-10-29 DIAGNOSIS — R279 Unspecified lack of coordination: Secondary | ICD-10-CM

## 2021-10-29 NOTE — Therapy (Signed)
[facility] @ [place], [place], [address] Phone: [phone]   Fax:  [phone]  Physical Therapy Treatment  Patient Details  Name: Veronica [name] MRN: [id number] Date of Birth: 06-Nov-1950 Referring Provider (PT): Megan Salon, MD   Encounter Date: 10/29/2021   PT End of Session - 10/29/21 1435     Visit Number 5    Date for PT Re-Evaluation 11/27/21    Authorization Type MCR    Progress Note Due on Visit 10    PT Start Time 1400    PT Stop Time 1434   pt denied additional needs for PT and ended session early   PT Time Calculation (min) 34 min    Activity Tolerance Patient tolerated treatment well    Behavior During Therapy [facility] for tasks assessed/performed             Past Medical History:  Diagnosis Date   Erosive esophagitis    on nexium since, with good GERD relief   GERD (gastroesophageal reflux disease)    High cholesterol    Hypothyroid    Miscarriage    G3P2   PE (pulmonary embolism) 1977   secondary falling down stairs with subsequent renal failure   Shingles 2014   Vitamin B12 deficiency    due to small intestine resection, malabsorption , on shots   Vitamin D deficiency     Past Surgical History:  Procedure Laterality Date   [place]   for scar tissue and bowel obstruction.near junction of colon and small bowel per patient   BUNIONECTOMY  2004   scars on both feet   LAPARASCOPY  1978   TUBAL LIGATION      There were no vitals filed for this visit.   Subjective Assessment - 10/29/21 1403     Subjective Pt reports "I think I'm good!". Pt reports she is no longer having leakage.    Pertinent History 2 vaginal birth with tearing with both but more so with second with several stitches    How long can you sit comfortably? no limits    How long can you stand comfortably? no limits    How long can you walk comfortably? no limits    Currently  in Pain? No/denies                            Pelvic Floor Special Questions - 10/29/21 0001     Pelvic Floor Internal Exam deferred due to no longer having any leakage               [facility] Adult PT Treatment/Exercise - 10/29/21 0001       Self-Care   Self-Care Other Self-Care Comments    Other Self-Care Comments  Pt educated on HEP, overview of breathing and pelvic floor coordination again, and need of new referral from MD if there are additional PT needs in the future.      Exercises   Exercises Lumbar;Knee/Hip      Lumbar Exercises: Supine   Clam 20 reps    Clam Limitations black loop with kegel and exhale    Bridge 20 reps    Bridge Limitations 2x10 with exhale and kegal    Other Supine Lumbar Exercises hip flexion black loop x10    Other Supine Lumbar Exercises opp arm/knee press with exhale 2x10  PT Education - 10/29/21 1435     Education Details Pt educated on HEP, given handout for this, and pelvic floor/breating coordination for home. Pt also educated on need of MD referral if there are additional needs for PT in the future.    Person(s) Educated Patient    Methods Explanation;Demonstration;Tactile cues;Verbal cues    Comprehension Returned demonstration;Verbalized understanding              PT Short Term Goals - 10/29/21 1444       PT SHORT TERM GOAL #1   Title pt to be I with HEP    Time 4    Period Weeks    Status Achieved    Target Date 09/24/21      PT SHORT TERM GOAL #2   Title pt to demonstrate at least 3/5 pelvic floor strength to decrease urinary leakages.   not formally reassessed however pt no longer having any urinary leakage symptoms   Time 4    Period Weeks    Status Achieved    Target Date 09/24/21      PT SHORT TERM GOAL #3   Title pt to report no more than one leak per day to improve QOL.    Time 4    Period Weeks    Status Achieved    Target Date 09/24/21                PT Long Term Goals - 10/29/21 1445       PT LONG TERM GOAL #1   Title pt to be I with advanced HEP    Time 3    Period Months    Status Achieved    Target Date 11/27/21      PT LONG TERM GOAL #2   Title pt to demonstrate at least 4/5 pelvic floor strength to decrease urinary leakages.   not formally reassessed however pt report no urinary leakage symptoms and now able to feel contractions and ability to complete without cues or feedback   Time 3    Period Months    Status Achieved    Target Date 11/27/21      PT LONG TERM GOAL #3   Title pt to report no more than one leak per week to improve QOL.    Time 3    Period Months    Status Achieved    Target Date 11/27/21                   Plan - 10/29/21 1436     Clinical Impression Statement Pt presents to clinic reporting she has had a complete resolution of symptoms very pleased with progress. Pt session focused on hip and core active with breathing and pelvic floor contractions, pt does struggle with coordination of all three task components and educated to break these up with activity and breathing or activity and pelvic floor components to maintain form until able to progress. However pt's symptoms resolved and pt reports she is ready to be DC'd and feels confident she can maintain progress and continue HEP as well. Pt denied additional questions, Pt went over HEP with her during session and good technique shown in clinic. Pt agreeable to discharge today, no additional needs, and knows to return with new referral from MD if needed in the future.    Personal Factors and Comorbidities Comorbidity 1    Comorbidities 2 vaginal births    Examination-Activity Limitations Continence;Hygiene/Grooming;Toileting    Examination-Participation Restrictions [employer]  Activity    Stability/Clinical Decision Making Stable/Uncomplicated    Rehab Potential Good    PT Frequency 1x / week    PT Duration 6 weeks    PT  Treatment/Interventions Functional mobility training;Therapeutic activities;Therapeutic exercise;Neuromuscular re-education;Manual techniques;Patient/family education;Passive range of motion;Energy conservation    PT Home Exercise Plan [id number]    Consulted and Agree with Plan of Care Patient             Patient will benefit from skilled therapeutic intervention in order to improve the following deficits and impairments:  Decreased strength, Impaired flexibility, Improper body mechanics, Decreased endurance, Decreased coordination  Visit Diagnosis: Muscle weakness (generalized)  Lack of coordination     Problem List Patient Active Problem List   Diagnosis Date Noted   Stress incontinence 06/16/2021   Sleep choking syndrome 02/28/2020   Loud snoring 02/28/2020   Bilateral sensorineural hearing loss 12/10/2018   Excessive cerumen in right ear canal 12/10/2018   Family history of colonic polyps 11/03/2018   Abnormal TSH 08/10/2017   Hypersomnia, persistent 03/24/2013   UARS (upper airway resistance syndrome) 03/24/2013    PHYSICAL THERAPY DISCHARGE SUMMARY  Visits from Start of Care: 5  Current functional level related to goals / functional outcomes:All goals met   Remaining deficits: NONE   Education / Equipment: HEP   Patient agrees to discharge. Patient goals were met. Patient is being discharged due to meeting the stated rehab goals. Thank  you for referral.    Junie Panning, PT 10/29/2021, 2:46 PM  [facility] @ [address] [place], [place], [address] Phone: [phone]   Fax:  [phone]  Name: Veronica [name] MRN: [id number] Date of Birth: Aug 07, 1951

## 2021-11-04 ENCOUNTER — Encounter (HOSPITAL_BASED_OUTPATIENT_CLINIC_OR_DEPARTMENT_OTHER): Payer: Self-pay | Admitting: Obstetrics & Gynecology

## 2021-11-04 ENCOUNTER — Other Ambulatory Visit (HOSPITAL_COMMUNITY)
Admission: RE | Admit: 2021-11-04 | Discharge: 2021-11-04 | Disposition: A | Discharge status: Home / Self Care | Payer: Medicare Other | Source: Ambulatory Visit | Attending: Obstetrics & Gynecology | Admitting: Obstetrics & Gynecology

## 2021-11-04 ENCOUNTER — Other Ambulatory Visit: Payer: Self-pay

## 2021-11-04 ENCOUNTER — Ambulatory Visit (INDEPENDENT_AMBULATORY_CARE_PROVIDER_SITE_OTHER): Payer: Medicare Other | Admitting: Obstetrics & Gynecology

## 2021-11-04 VITALS — BP 133/81 | HR 66 | Ht 65.0 in | Wt 143.0 lb

## 2021-11-04 DIAGNOSIS — Z780 Asymptomatic menopausal state: Secondary | ICD-10-CM | POA: Diagnosis not present

## 2021-11-04 DIAGNOSIS — R195 Other fecal abnormalities: Secondary | ICD-10-CM

## 2021-11-04 DIAGNOSIS — E538 Deficiency of other specified B group vitamins: Secondary | ICD-10-CM

## 2021-11-04 DIAGNOSIS — R32 Unspecified urinary incontinence: Secondary | ICD-10-CM | POA: Diagnosis not present

## 2021-11-04 DIAGNOSIS — Z86711 Personal history of pulmonary embolism: Secondary | ICD-10-CM | POA: Diagnosis not present

## 2021-11-04 DIAGNOSIS — Z124 Encounter for screening for malignant neoplasm of cervix: Secondary | ICD-10-CM | POA: Insufficient documentation

## 2021-11-04 DIAGNOSIS — Z01419 Encounter for gynecological examination (general) (routine) without abnormal findings: Secondary | ICD-10-CM | POA: Diagnosis not present

## 2021-11-04 NOTE — Progress Notes (Signed)
71 y.o. G2P2 Widowed White or Caucasian female here for breast and pelvic exam.  I am also following her for h/o stress incontinence.  Recently completed PT and urinary symptoms are much improved.  Denies vaginal bleeding.  Patient's last menstrual period was 10/13/2002.          Sexually active: Yes.    H/O STD:  no  Health Maintenance: PCP:  Dr. Joylene Draft.  Last wellness appt was 07/2021.  Did blood work at that appt:  yes Vaccines are up to date:  reviewed with pt.  I do not have tdap or pneumonia vaccination dates Colonoscopy:  04/01/2019 MMG:  09/17/2020 Negative BMD:  does with PCP Last pap smear:  10/26/2018 Negative.   H/o abnormal pap smear:  no   reports that she has quit smoking. Her smoking use included cigarettes. She has a 1.00 pack-year smoking history. She has never used smokeless tobacco. She reports current alcohol use of about 2.0 standard drinks per week. She reports that she does not use drugs.  Past Medical History:  Diagnosis Date   Erosive esophagitis    on nexium since, with good GERD relief   GERD (gastroesophageal reflux disease)    High cholesterol    Hypothyroid    Miscarriage    G3P2   PE (pulmonary embolism) 1977   secondary falling down stairs with subsequent renal failure   Shingles 2014   Vitamin B12 deficiency    due to small intestine resection, malabsorption , on shots   Vitamin D deficiency     Past Surgical History:  Procedure Laterality Date   [place]   for scar tissue and bowel obstruction.near junction of colon and small bowel per patient   BUNIONECTOMY  2004   scars on both feet   LAPARASCOPY  1978   TUBAL LIGATION      Current Outpatient Medications  Medication Sig Dispense Refill   cholecalciferol (VITAMIN D3) 25 MCG (1000 UT) tablet Take 1,000 Units by mouth daily.     Cyanocobalamin 1000 MCG SUBL Inject as directed. One injection every 2 weeks     sertraline (ZOLOFT) 25 MG tablet Take 25 mg  by mouth 2 (two) times daily.     VYVANSE 20 MG capsule Take 20 mg by mouth daily.  0   zolpidem (AMBIEN) 5 MG tablet Take 2.5 mg by mouth at bedtime. Pt breaks 5mg  in half and takes prn  1   No current facility-administered medications for this visit.    Family History  Problem Relation Age of Onset   Transient ischemic attack Mother    Arthritis Mother    Hypertension Mother    Ulcers Mother    COPD Mother    Hypothyroidism Mother    Cancer - Lung Father    Hypertension Father    Diabetes Sister        whooping cough   Diabetes Paternal Aunt    Diabetes Paternal Uncle    Cancer Paternal Grandmother        esophageal   Hypertension Paternal Grandmother    Hypertension Maternal Grandmother    Heart disease Maternal Grandmother    Hypothyroidism Sister    Depression Other        maternal side   Depression Other        paternal side    Review of Systems  All other systems reviewed and are negative.  Exam:   BP 133/81 (BP Location: Right  Arm, Patient Position: Sitting, Cuff Size: Normal)    Pulse 66    Ht 5\' 5"  (1.651 m)    Wt 143 lb (64.9 kg)    LMP 10/13/2002    BMI 23.80 kg/m   Height: 5\' 5"  (165.1 cm)  General appearance: alert, cooperative and appears stated age Breasts: normal appearance, no masses or tenderness Abdomen: soft, non-tender; bowel sounds normal; no masses,  no organomegaly Lymph nodes: Cervical, supraclavicular, and axillary nodes normal.  No abnormal inguinal nodes palpated Neurologic: Grossly normal  Pelvic: External genitalia:  no lesions              Urethra:  normal appearing urethra with no masses, tenderness or lesions              Bartholins and Skenes: normal                 Vagina: normal appearing vagina with atrophic changes and no discharge, no lesions              Cervix: no lesions              Pap taken: Yes.   Bimanual Exam:  Uterus:  normal size, contour, position, consistency, mobility, non-tender              Adnexa: normal  adnexa and no mass, fullness, tenderness               Rectovaginal: Confirms               Anus:  normal sphincter tone, no lesions  Chaperone, Octaviano Batty, CMA, was present for exam.  Assessment/Plan: 1. Encntr for gyn exam (general) (routine) w/o abn findings - pap obtained today - MMG 09/2020 - colonoscopy 2020 - BMD done with Dr. Joylene Draft - vaccines reviewed/updated - lab work done with Dr. Joylene Draft  2. Postmenopausal - no HRT  3. Stool color abnormal - Hepatic function panel - US Abdomen Limited RUQ (LIVER/GB); Future  4. B12 deficiency - B12  5. History of pulmonary embolism  6. Cervical cancer screening - Cytology - PAP( [facility]) - PR OBTAINING SCREEN PAP SMEAR

## 2021-11-05 LAB — CYTOLOGY - PAP: Diagnosis: NEGATIVE

## 2021-11-05 LAB — HEPATIC FUNCTION PANEL
ALT: 14 IU/L (ref 0–32)
AST: 19 IU/L (ref 0–40)
Albumin: 4.5 g/dL (ref 3.8–4.8)
Alkaline Phosphatase: 99 IU/L (ref 44–121)
Bilirubin Total: 0.6 mg/dL (ref 0.0–1.2)
Bilirubin, Direct: 0.16 mg/dL (ref 0.00–0.40)
Total Protein: 6.6 g/dL (ref 6.0–8.5)

## 2021-11-05 LAB — VITAMIN B12: Vitamin B-12: 554 pg/mL (ref 232–1245)

## 2021-11-08 ENCOUNTER — Ambulatory Visit
Admission: RE | Admit: 2021-11-08 | Discharge: 2021-11-08 | Disposition: A | Discharge status: Home / Self Care | Payer: Medicare Other | Source: Ambulatory Visit | Attending: Obstetrics & Gynecology | Admitting: Obstetrics & Gynecology

## 2021-11-08 DIAGNOSIS — R195 Other fecal abnormalities: Secondary | ICD-10-CM

## 2021-11-08 DIAGNOSIS — R1013 Epigastric pain: Secondary | ICD-10-CM | POA: Diagnosis not present

## 2021-11-12 DIAGNOSIS — L603 Nail dystrophy: Secondary | ICD-10-CM | POA: Diagnosis not present

## 2021-11-12 DIAGNOSIS — Z20822 Contact with and (suspected) exposure to covid-19: Secondary | ICD-10-CM | POA: Diagnosis not present

## 2021-11-12 DIAGNOSIS — B351 Tinea unguium: Secondary | ICD-10-CM | POA: Diagnosis not present

## 2021-11-12 DIAGNOSIS — L608 Other nail disorders: Secondary | ICD-10-CM | POA: Diagnosis not present

## 2021-11-12 DIAGNOSIS — L570 Actinic keratosis: Secondary | ICD-10-CM | POA: Diagnosis not present

## 2021-11-27 DIAGNOSIS — Z20822 Contact with and (suspected) exposure to covid-19: Secondary | ICD-10-CM | POA: Diagnosis not present

## 2021-12-11 DIAGNOSIS — R194 Change in bowel habit: Secondary | ICD-10-CM | POA: Diagnosis not present

## 2021-12-11 DIAGNOSIS — R159 Full incontinence of feces: Secondary | ICD-10-CM | POA: Diagnosis not present

## 2021-12-11 DIAGNOSIS — K589 Irritable bowel syndrome without diarrhea: Secondary | ICD-10-CM | POA: Diagnosis not present

## 2021-12-11 DIAGNOSIS — K219 Gastro-esophageal reflux disease without esophagitis: Secondary | ICD-10-CM | POA: Diagnosis not present

## 2021-12-26 DIAGNOSIS — Z20828 Contact with and (suspected) exposure to other viral communicable diseases: Secondary | ICD-10-CM | POA: Diagnosis not present

## 2022-01-16 DIAGNOSIS — Z20822 Contact with and (suspected) exposure to covid-19: Secondary | ICD-10-CM | POA: Diagnosis not present

## 2022-01-16 DIAGNOSIS — R1310 Dysphagia, unspecified: Secondary | ICD-10-CM | POA: Diagnosis not present

## 2022-01-16 DIAGNOSIS — K581 Irritable bowel syndrome with constipation: Secondary | ICD-10-CM | POA: Diagnosis not present

## 2022-01-16 DIAGNOSIS — R1013 Epigastric pain: Secondary | ICD-10-CM | POA: Diagnosis not present

## 2022-01-16 DIAGNOSIS — K219 Gastro-esophageal reflux disease without esophagitis: Secondary | ICD-10-CM | POA: Diagnosis not present

## 2022-01-16 DIAGNOSIS — Z20828 Contact with and (suspected) exposure to other viral communicable diseases: Secondary | ICD-10-CM | POA: Diagnosis not present

## 2022-01-21 DIAGNOSIS — Z20828 Contact with and (suspected) exposure to other viral communicable diseases: Secondary | ICD-10-CM | POA: Diagnosis not present

## 2022-02-03 DIAGNOSIS — Z20822 Contact with and (suspected) exposure to covid-19: Secondary | ICD-10-CM | POA: Diagnosis not present

## 2022-02-05 DIAGNOSIS — E559 Vitamin D deficiency, unspecified: Secondary | ICD-10-CM | POA: Diagnosis not present

## 2022-02-05 DIAGNOSIS — E039 Hypothyroidism, unspecified: Secondary | ICD-10-CM | POA: Diagnosis not present

## 2022-02-05 DIAGNOSIS — I700 Atherosclerosis of aorta: Secondary | ICD-10-CM | POA: Diagnosis not present

## 2022-02-05 DIAGNOSIS — E785 Hyperlipidemia, unspecified: Secondary | ICD-10-CM | POA: Diagnosis not present

## 2022-02-07 DIAGNOSIS — R030 Elevated blood-pressure reading, without diagnosis of hypertension: Secondary | ICD-10-CM | POA: Diagnosis not present

## 2022-02-07 DIAGNOSIS — K589 Irritable bowel syndrome without diarrhea: Secondary | ICD-10-CM | POA: Diagnosis not present

## 2022-02-07 DIAGNOSIS — J439 Emphysema, unspecified: Secondary | ICD-10-CM | POA: Diagnosis not present

## 2022-02-07 DIAGNOSIS — M25551 Pain in right hip: Secondary | ICD-10-CM | POA: Diagnosis not present

## 2022-02-07 DIAGNOSIS — Z1212 Encounter for screening for malignant neoplasm of rectum: Secondary | ICD-10-CM | POA: Diagnosis not present

## 2022-02-07 DIAGNOSIS — R82998 Other abnormal findings in urine: Secondary | ICD-10-CM | POA: Diagnosis not present

## 2022-02-07 DIAGNOSIS — Z20822 Contact with and (suspected) exposure to covid-19: Secondary | ICD-10-CM | POA: Diagnosis not present

## 2022-02-07 DIAGNOSIS — K219 Gastro-esophageal reflux disease without esophagitis: Secondary | ICD-10-CM | POA: Diagnosis not present

## 2022-02-07 DIAGNOSIS — R202 Paresthesia of skin: Secondary | ICD-10-CM | POA: Diagnosis not present

## 2022-02-07 DIAGNOSIS — I700 Atherosclerosis of aorta: Secondary | ICD-10-CM | POA: Diagnosis not present

## 2022-02-07 DIAGNOSIS — M47816 Spondylosis without myelopathy or radiculopathy, lumbar region: Secondary | ICD-10-CM | POA: Diagnosis not present

## 2022-02-07 DIAGNOSIS — Z1339 Encounter for screening examination for other mental health and behavioral disorders: Secondary | ICD-10-CM | POA: Diagnosis not present

## 2022-02-07 DIAGNOSIS — Z0000 Encounter for general adult medical examination without abnormal findings: Secondary | ICD-10-CM | POA: Diagnosis not present

## 2022-02-07 DIAGNOSIS — M47812 Spondylosis without myelopathy or radiculopathy, cervical region: Secondary | ICD-10-CM | POA: Diagnosis not present

## 2022-02-07 DIAGNOSIS — Z1331 Encounter for screening for depression: Secondary | ICD-10-CM | POA: Diagnosis not present

## 2022-02-07 DIAGNOSIS — G629 Polyneuropathy, unspecified: Secondary | ICD-10-CM | POA: Diagnosis not present

## 2022-02-07 DIAGNOSIS — E538 Deficiency of other specified B group vitamins: Secondary | ICD-10-CM | POA: Diagnosis not present

## 2022-02-07 DIAGNOSIS — Z23 Encounter for immunization: Secondary | ICD-10-CM | POA: Diagnosis not present

## 2022-02-07 DIAGNOSIS — H9190 Unspecified hearing loss, unspecified ear: Secondary | ICD-10-CM | POA: Diagnosis not present

## 2022-02-11 ENCOUNTER — Other Ambulatory Visit: Payer: Self-pay | Admitting: Internal Medicine

## 2022-02-11 DIAGNOSIS — R200 Anesthesia of skin: Secondary | ICD-10-CM

## 2022-02-11 DIAGNOSIS — E538 Deficiency of other specified B group vitamins: Secondary | ICD-10-CM | POA: Diagnosis not present

## 2022-02-12 ENCOUNTER — Ambulatory Visit
Admission: RE | Admit: 2022-02-12 | Discharge: 2022-02-12 | Disposition: A | Discharge status: Home / Self Care | Payer: Medicare Other | Source: Ambulatory Visit | Attending: Internal Medicine | Admitting: Internal Medicine

## 2022-02-12 DIAGNOSIS — M4312 Spondylolisthesis, cervical region: Secondary | ICD-10-CM | POA: Diagnosis not present

## 2022-02-12 DIAGNOSIS — R200 Anesthesia of skin: Secondary | ICD-10-CM | POA: Diagnosis not present

## 2022-02-17 DIAGNOSIS — K2100 Gastro-esophageal reflux disease with esophagitis, without bleeding: Secondary | ICD-10-CM | POA: Diagnosis not present

## 2022-02-17 DIAGNOSIS — K449 Diaphragmatic hernia without obstruction or gangrene: Secondary | ICD-10-CM | POA: Diagnosis not present

## 2022-02-17 DIAGNOSIS — R1013 Epigastric pain: Secondary | ICD-10-CM | POA: Diagnosis not present

## 2022-02-17 DIAGNOSIS — E039 Hypothyroidism, unspecified: Secondary | ICD-10-CM | POA: Diagnosis not present

## 2022-02-17 DIAGNOSIS — R1310 Dysphagia, unspecified: Secondary | ICD-10-CM | POA: Diagnosis not present

## 2022-02-17 DIAGNOSIS — K2289 Other specified disease of esophagus: Secondary | ICD-10-CM | POA: Diagnosis not present

## 2022-02-17 DIAGNOSIS — K209 Esophagitis, unspecified without bleeding: Secondary | ICD-10-CM | POA: Diagnosis not present

## 2022-02-19 DIAGNOSIS — Z20822 Contact with and (suspected) exposure to covid-19: Secondary | ICD-10-CM | POA: Diagnosis not present

## 2022-02-20 DIAGNOSIS — Z20822 Contact with and (suspected) exposure to covid-19: Secondary | ICD-10-CM | POA: Diagnosis not present

## 2022-02-25 ENCOUNTER — Encounter: Payer: Self-pay | Admitting: Neurology

## 2022-02-25 ENCOUNTER — Encounter: Payer: Self-pay | Admitting: Orthopaedic Surgery

## 2022-02-25 ENCOUNTER — Ambulatory Visit (INDEPENDENT_AMBULATORY_CARE_PROVIDER_SITE_OTHER): Payer: Medicare Other | Admitting: Orthopaedic Surgery

## 2022-02-25 DIAGNOSIS — G5603 Carpal tunnel syndrome, bilateral upper limbs: Secondary | ICD-10-CM | POA: Insufficient documentation

## 2022-02-25 DIAGNOSIS — M79642 Pain in left hand: Secondary | ICD-10-CM

## 2022-02-25 DIAGNOSIS — M79641 Pain in right hand: Secondary | ICD-10-CM | POA: Diagnosis not present

## 2022-02-25 NOTE — Progress Notes (Signed)
? ?Office Visit Note ?  ?Patient: Veronica [name]           ?Date of Birth: [birth year]           ?MRN: [id number] ?Visit Date: 02/25/2022 ?             ?Requested by: Crist Infante, MD ?[address] ?[place],  [place] [address] ?PCP: Crist Infante, MD ? ? ?Assessment & Plan: ?Visit Diagnoses: Bilateral hand numbness ?Plan: Patient presents today with a history of right greater than left hand pain and numbness that is been going on for 5 months.  She is right-hand dominant.  She denies any injuries.  She does notice that it wakes her up at night and she has to shake her hands.  Also notices it when she is driving.  She does have a history of neck arthritis but this is asymptomatic to her.  She was unable to produce any the symptoms shooting down her arms with movement of her neck.  She did have a positive Tinel's test and once testing.  Findings most consistent with carpal tunnel syndrome.  Will order bilateral upper extremity nerve conduction studies ? ?Follow-Up Instructions: No follow-ups on file.  ? ?Orders:  ?No orders of the defined types were placed in this encounter. ? ?No orders of the defined types were placed in this encounter. ? ? ? ? Procedures: ?No procedures performed ? ? ?Clinical Data: ?No additional findings. ? ? ?Subjective: ?Chief Complaint  ?Patient presents with  ? Right Hand - Pain, Numbness  ? Left Hand - Pain, Numbness  ?Patient presents today with bilateral hand and arm pain. She said that it has been an issue for 5 months, no known injury. She is right hand dominant. She said that the pain wakes her at night. She has pain in her upper arm and radiates into her arm and hands. She takes Aleve is absolutely necessary.  ? ? ? ?Review of Systems  ?All other systems reviewed and are negative. ? ? ?Objective: ?Vital Signs: LMP 10/13/2002  ? ?Physical Exam ?Constitutional:   ?   Appearance: Normal appearance.  ?Pulmonary:  ?   Effort: Pulmonary effort is normal.  ?Skin: ?   General: Skin is warm and  dry.  ?Neurological:  ?   Mental Status: She is alert.  ? ? ?Ortho Exam ?Examination of her neck she has good forward flexion extension side to side bending without any pain.  No tenderness over cervical spine.  She has right greater than left positive Tinel's sign and Phalen sign.  Pulses are intact she has brisk capillary refill she is able to oppose her thumb to her pinky finger. ?Specialty Comments:  ?No specialty comments available. ? ?Imaging: ?No results found. ? ? ?PMFS History: ?Patient Active Problem List  ? Diagnosis Date Noted  ? Stress incontinence 06/16/2021  ? Sleep choking syndrome 02/28/2020  ? Loud snoring 02/28/2020  ? Bilateral sensorineural hearing loss 12/10/2018  ? Excessive cerumen in right ear canal 12/10/2018  ? Family history of colonic polyps 11/03/2018  ? Abnormal TSH 08/10/2017  ? Hypersomnia, persistent 03/24/2013  ? UARS (upper airway resistance syndrome) 03/24/2013  ? ?Past Medical History:  ?Diagnosis Date  ? Erosive esophagitis   ? on nexium since, with good GERD relief  ? GERD (gastroesophageal reflux disease)   ? High cholesterol   ? Hypothyroid   ? Miscarriage   ? G3P2  ? PE (pulmonary embolism) 1977  ? secondary falling down stairs with  subsequent renal failure  ? Shingles 2014  ? Vitamin B12 deficiency   ? due to small intestine resection, malabsorption , on shots  ? Vitamin D deficiency   ?  ?Family History  ?Problem Relation Age of Onset  ? Transient ischemic attack Mother   ? Arthritis Mother   ? Hypertension Mother   ? Ulcers Mother   ? COPD Mother   ? Hypothyroidism Mother   ? Cancer - Lung Father   ? Hypertension Father   ? Diabetes Sister   ?     whooping cough  ? Diabetes Paternal Aunt   ? Diabetes Paternal Uncle   ? Cancer Paternal Grandmother   ?     esophageal  ? Hypertension Paternal Grandmother   ? Hypertension Maternal Grandmother   ? Heart disease Maternal Grandmother   ? Hypothyroidism Sister   ? Depression Other   ?     maternal side  ? Depression Other   ?      paternal side  ?  ?Past Surgical History:  ?Procedure Laterality Date  ? APPENDECTOMY  1968  ? BOWEL RESECTION  1979  ? for scar tissue and bowel obstruction.near junction of colon and small bowel per patient  ? BUNIONECTOMY  2004  ? scars on both feet  ? LAPARASCOPY  1978  ? TUBAL LIGATION    ? ?Social History  ? ?Occupational History  ? Occupation: retired  ?  Comment: property management  ?Tobacco Use  ? Smoking status: Former  ?  Packs/day: 0.50  ?  Years: 2.00  ?  Pack years: 1.00  ?  Types: Cigarettes  ? Smokeless tobacco: Never  ? Tobacco comments:  ?  quit in 1978  ?Vaping Use  ? Vaping Use: Never used  ?Substance and Sexual Activity  ? Alcohol use: Yes  ?  Alcohol/week: 2.0 standard drinks  ?  Types: 2 Standard drinks or equivalent per week  ? Drug use: No  ? Sexual activity: Yes  ?  Partners: Male  ?  Birth control/protection: Surgical, Post-menopausal  ?  Comment: BTL  ? ? ? ? ? ? ?

## 2022-02-26 ENCOUNTER — Telehealth: Payer: Self-pay | Admitting: Orthopaedic Surgery

## 2022-02-26 ENCOUNTER — Other Ambulatory Visit: Payer: Self-pay

## 2022-02-26 DIAGNOSIS — M79642 Pain in left hand: Secondary | ICD-10-CM

## 2022-02-26 NOTE — Telephone Encounter (Signed)
Entered referral for [facility] to do EMG study. ?

## 2022-02-26 NOTE — Telephone Encounter (Signed)
[facility] cant do the nerve study till  Aug 31- she cant wait that long ? ?Please call  ?

## 2022-02-26 NOTE — Telephone Encounter (Signed)
Called and left voicemail that referral has been sent to [facility].  ?

## 2022-03-18 DIAGNOSIS — K581 Irritable bowel syndrome with constipation: Secondary | ICD-10-CM | POA: Diagnosis not present

## 2022-03-18 DIAGNOSIS — K219 Gastro-esophageal reflux disease without esophagitis: Secondary | ICD-10-CM | POA: Diagnosis not present

## 2022-03-18 DIAGNOSIS — K449 Diaphragmatic hernia without obstruction or gangrene: Secondary | ICD-10-CM | POA: Diagnosis not present

## 2022-04-01 ENCOUNTER — Ambulatory Visit (INDEPENDENT_AMBULATORY_CARE_PROVIDER_SITE_OTHER): Payer: Medicare Other | Admitting: Physical Medicine and Rehabilitation

## 2022-04-01 ENCOUNTER — Encounter: Payer: Self-pay | Admitting: Physical Medicine and Rehabilitation

## 2022-04-01 DIAGNOSIS — R202 Paresthesia of skin: Secondary | ICD-10-CM

## 2022-04-01 NOTE — Progress Notes (Unsigned)
Pt state she has pain from her right arm and down to her hand, numbness in her thumb and index finger. Pt state left hand tingling and tightness. Pt state it hard for her to make a fist, hold items and twisting open tops makes the pain worse.Marland Kitchen Pt she takes over the counter pain meds to help ease her pain. Pt state she right handed.  Numeric Pain Rating Scale and Functional Assessment Average Pain 7   In the last MONTH (on 0-10 scale) has pain interfered with the following?  1. General activity like being  able to carry out your everyday physical activities such as walking, climbing stairs, carrying groceries, or moving a chair?  Rating(9)    -BT, -Dye Allergies.

## 2022-04-03 NOTE — Progress Notes (Unsigned)
REATHA SUR - 71 y.o. female MRN [id number]  Date of birth: [birth year]  Office Visit Note: Visit Date: 04/01/2022 PCP: Crist Infante, MD Referred by: Garald Balding, MD  Subjective: Chief Complaint  Patient presents with   Right Arm - Pain   Right Hand - Pain, Numbness   Left Hand - Pain, Numbness   HPI:  Veronica [name] is a 71 y.o. female who comes in todayHPI ROS Otherwise per HPI.  Assessment & Plan: Visit Diagnoses:    ICD-10-CM   1. Paresthesia of skin  R20.2 NCV with EMG (electromyography)      Plan: Impression: The above electrodiagnostic study is ABNORMAL and reveals evidence of a severe BILATERAL Right much worse than left median nerve entrapment at the wrist (carpal tunnel syndrome) affecting sensory and motor components. There is no significant electrodiagnostic evidence of any other focal nerve entrapment, brachial plexopathy or cervical radiculopathy.   Recommendations: 1.  Follow-up with referring physician. 2.  Continue current management of symptoms. 3.  Suggest surgical evaluation.  Meds & Orders: No orders of the defined types were placed in this encounter.   Orders Placed This Encounter  Procedures   NCV with EMG (electromyography)    Follow-up: Return in about 2 weeks (around 04/15/2022) for Joni Fears, MD.   Procedures: No procedures performed  EMG & NCV Findings: Evaluation of the left median motor and the right median motor nerves showed prolonged distal onset latency (L7.5, R8.4 ms), reduced amplitude (L4.6, R2.4 mV), and decreased conduction velocity (Elbow-Wrist, L46, R43 m/s).  The left radial motor nerve showed prolonged distal onset latency (3.0 ms).  The left median (across palm) sensory and the right median (across palm) sensory nerves showed no response (Palm) and prolonged distal peak latency (L7.3, R7.5 ms).  All remaining nerves (as indicated in the following tables) were within normal limits.  Left vs. Right side comparison  data for the median motor nerve indicates abnormal L-R latency difference (0.9 ms).  All remaining left vs. right side differences were within normal limits.    Needle evaluation of the right abductor pollicis brevis muscle showed increased insertional activity and slightly increased spontaneous activity.  All remaining muscles (as indicated in the following table) showed no evidence of electrical instability.    Impression: The above electrodiagnostic study is ABNORMAL and reveals evidence of a severe BILATERAL Right much worse than left median nerve entrapment at the wrist (carpal tunnel syndrome) affecting sensory and motor components. There is no significant electrodiagnostic evidence of any other focal nerve entrapment, brachial plexopathy or cervical radiculopathy.   Recommendations: 1.  Follow-up with referring physician. 2.  Continue current management of symptoms. 3.  Suggest surgical evaluation.  ___________________________ Laurence Spates FAAPMR Board Certified, American Board of Physical Medicine and Rehabilitation    Nerve Conduction Studies Anti Sensory Summary Table   Stim Site NR Peak (ms) Norm Peak (ms) P-T Amp (V) Norm P-T Amp Site1 Site2 Delta-P (ms) Dist (cm) Vel (m/s) Norm Vel (m/s)  Left Median Acr Palm Anti Sensory (2nd Digit)  29.2C  Wrist    *7.3 <3.6 14.4 >10 Wrist Palm  0.0    Palm *NR  <2.0          Right Median Acr Palm Anti Sensory (2nd Digit)  28.3C  Wrist    *7.5 <3.6 10.2 >10 Wrist Palm  0.0    Palm *NR  <2.0          Left Radial Anti Sensory (Base 1st  Digit)  28.9C  Wrist    2.1 <3.1 31.3  Wrist Base 1st Digit 2.1 0.0    Right Radial Anti Sensory (Base 1st Digit)  29C  Wrist    2.2 <3.1 36.9  Wrist Base 1st Digit 2.2 0.0    Left Ulnar Anti Sensory (5th Digit)  29.5C  Wrist    3.4 <3.7 19.5 >15.0 Wrist 5th Digit 3.4 14.0 41 >38  Right Ulnar Anti Sensory (5th Digit)  28.8C  Wrist    3.5 <3.7 17.1 >15.0 Wrist 5th Digit 3.5 14.0 40 >38   Motor  Summary Table   Stim Site NR Onset (ms) Norm Onset (ms) O-P Amp (mV) Norm O-P Amp Site1 Site2 Delta-0 (ms) Dist (cm) Vel (m/s) Norm Vel (m/s)  Left Median Motor (Abd Poll Brev)  29.4C  Wrist    *7.5 <4.2 *4.6 >5 Elbow Wrist 4.6 21.0 *46 >50  Elbow    12.1  4.3         Right Median Motor (Abd Poll Brev)  28.9C  Wrist    *8.4 <4.2 *2.4 >5 Elbow Wrist 4.9 21.0 *43 >50  Elbow    13.3  2.3         Left Radial Motor (Ext Indicis)  29.6C    Ulnar  8cm    *3.0 <2.5 7.3 >1.7 Up Arm 8cm 3.3 19.0 58 >53  Up Arm    6.3  6.5  Axilla Up Arm 1.6 10.0 62   Axilla    7.9  6.3         Right Ulnar Motor (Abd Dig Min)  28.7C  Wrist    3.1 <4.2 6.8 >3 B Elbow Wrist 3.4 19.5 57 >53  B Elbow    6.5  6.4  A Elbow B Elbow 1.4 10.0 71 >53  A Elbow    7.9  5.8          EMG   Side Muscle Nerve Root Ins Act Fibs Psw Amp Dur Poly Recrt Int Fraser Din Comment  Right Abd Poll Brev Median C8-T1 *Incr *1+ *1+ Nml Nml 0 Nml Nml   Right 1stDorInt Ulnar C8-T1 Nml Nml Nml Nml Nml 0 Nml Nml   Right PronatorTeres Median C6-7 Nml Nml Nml Nml Nml 0 Nml Nml   Right Biceps Musculocut C5-6 Nml Nml Nml Nml Nml 0 Nml Nml   Right Deltoid Axillary C5-6 Nml Nml Nml Nml Nml 0 Nml Nml     Nerve Conduction Studies Anti Sensory Left/Right Comparison   Stim Site L Lat (ms) R Lat (ms) L-R Lat (ms) L Amp (V) R Amp (V) L-R Amp (%) Site1 Site2 L Vel (m/s) R Vel (m/s) L-R Vel (m/s)  Median Acr Palm Anti Sensory (2nd Digit)  29.2C  Wrist *7.3 *7.5 0.2 14.4 10.2 29.2 Wrist Palm     Palm             Radial Anti Sensory (Base 1st Digit)  28.9C  Wrist 2.1 2.2 0.1 31.3 36.9 15.2 Wrist Base 1st Digit     Ulnar Anti Sensory (5th Digit)  29.5C  Wrist 3.4 3.5 0.1 19.5 17.1 12.3 Wrist 5th Digit 41 40 1   Motor Left/Right Comparison   Stim Site L Lat (ms) R Lat (ms) L-R Lat (ms) L Amp (mV) R Amp (mV) L-R Amp (%) Site1 Site2 L Vel (m/s) R Vel (m/s) L-R Vel (m/s)  Median Motor (Abd Poll Brev)  29.4C  Wrist *7.5 *8.4 *0.9 *4.6 *2.4 47.8 Elbow  Wrist *46 *43 3  Elbow 12.1 13.3 1.2 4.3 2.3 46.5       Radial Motor (Ext Indicis)  29.6C    Ulnar  8cm *3.0   7.3   Up Arm 8cm 58    Up Arm 6.3   6.5   Axilla Up Arm 62    Axilla 7.9   6.3         Ulnar Motor (Abd Dig Min)  28.7C  Wrist  3.1   6.8  B Elbow Wrist  57   B Elbow  6.5   6.4  A Elbow B Elbow  71   A Elbow  7.9   5.8           Waveforms:                      Clinical History: No specialty comments available.     Objective:  VS:  HT:    WT:   BMI:     BP:   HR: bpm  TEMP: ( )  RESP:  Physical Exam   Imaging: No results found.

## 2022-04-03 NOTE — Procedures (Unsigned)
EMG & NCV Findings: Evaluation of the left median motor and the right median motor nerves showed prolonged distal onset latency (L7.5, R8.4 ms), reduced amplitude (L4.6, R2.4 mV), and decreased conduction velocity (Elbow-Wrist, L46, R43 m/s).  The left radial motor nerve showed prolonged distal onset latency (3.0 ms).  The left median (across palm) sensory and the right median (across palm) sensory nerves showed no response (Palm) and prolonged distal peak latency (L7.3, R7.5 ms).  All remaining nerves (as indicated in the following tables) were within normal limits.  Left vs. Right side comparison data for the median motor nerve indicates abnormal L-R latency difference (0.9 ms).  All remaining left vs. right side differences were within normal limits.    Needle evaluation of the right abductor pollicis brevis muscle showed increased insertional activity and slightly increased spontaneous activity.  All remaining muscles (as indicated in the following table) showed no evidence of electrical instability.    Impression: The above electrodiagnostic study is ABNORMAL and reveals evidence of a severe BILATERAL Right much worse than left median nerve entrapment at the wrist (carpal tunnel syndrome) affecting sensory and motor components. There is no significant electrodiagnostic evidence of any other focal nerve entrapment, brachial plexopathy or cervical radiculopathy.   Recommendations: 1.  Follow-up with referring physician. 2.  Continue current management of symptoms. 3.  Suggest surgical evaluation.  ___________________________ Laurence Spates FAAPMR Board Certified, American Board of Physical Medicine and Rehabilitation    Nerve Conduction Studies Anti Sensory Summary Table   Stim Site NR Peak (ms) Norm Peak (ms) P-T Amp (V) Norm P-T Amp Site1 Site2 Delta-P (ms) Dist (cm) Vel (m/s) Norm Vel (m/s)  Left Median Acr Palm Anti Sensory (2nd Digit)  29.2C  Wrist    *7.3 <3.6 14.4 >10 Wrist Palm  0.0     Palm *NR  <2.0          Right Median Acr Palm Anti Sensory (2nd Digit)  28.3C  Wrist    *7.5 <3.6 10.2 >10 Wrist Palm  0.0    Palm *NR  <2.0          Left Radial Anti Sensory (Base 1st Digit)  28.9C  Wrist    2.1 <3.1 31.3  Wrist Base 1st Digit 2.1 0.0    Right Radial Anti Sensory (Base 1st Digit)  29C  Wrist    2.2 <3.1 36.9  Wrist Base 1st Digit 2.2 0.0    Left Ulnar Anti Sensory (5th Digit)  29.5C  Wrist    3.4 <3.7 19.5 >15.0 Wrist 5th Digit 3.4 14.0 41 >38  Right Ulnar Anti Sensory (5th Digit)  28.8C  Wrist    3.5 <3.7 17.1 >15.0 Wrist 5th Digit 3.5 14.0 40 >38   Motor Summary Table   Stim Site NR Onset (ms) Norm Onset (ms) O-P Amp (mV) Norm O-P Amp Site1 Site2 Delta-0 (ms) Dist (cm) Vel (m/s) Norm Vel (m/s)  Left Median Motor (Abd Poll Brev)  29.4C  Wrist    *7.5 <4.2 *4.6 >5 Elbow Wrist 4.6 21.0 *46 >50  Elbow    12.1  4.3         Right Median Motor (Abd Poll Brev)  28.9C  Wrist    *8.4 <4.2 *2.4 >5 Elbow Wrist 4.9 21.0 *43 >50  Elbow    13.3  2.3         Left Radial Motor (Ext Indicis)  29.6C    Ulnar  8cm    *3.0 <2.5 7.3 >1.7 Up Arm  8cm 3.3 19.0 58 >53  Up Arm    6.3  6.5  Axilla Up Arm 1.6 10.0 62   Axilla    7.9  6.3         Right Ulnar Motor (Abd Dig Min)  28.7C  Wrist    3.1 <4.2 6.8 >3 B Elbow Wrist 3.4 19.5 57 >53  B Elbow    6.5  6.4  A Elbow B Elbow 1.4 10.0 71 >53  A Elbow    7.9  5.8          EMG   Side Muscle Nerve Root Ins Act Fibs Psw Amp Dur Poly Recrt Int Fraser Din Comment  Right Abd Poll Brev Median C8-T1 *Incr *1+ *1+ Nml Nml 0 Nml Nml   Right 1stDorInt Ulnar C8-T1 Nml Nml Nml Nml Nml 0 Nml Nml   Right PronatorTeres Median C6-7 Nml Nml Nml Nml Nml 0 Nml Nml   Right Biceps Musculocut C5-6 Nml Nml Nml Nml Nml 0 Nml Nml   Right Deltoid Axillary C5-6 Nml Nml Nml Nml Nml 0 Nml Nml     Nerve Conduction Studies Anti Sensory Left/Right Comparison   Stim Site L Lat (ms) R Lat (ms) L-R Lat (ms) L Amp (V) R Amp (V) L-R Amp (%) Site1 Site2 L Vel  (m/s) R Vel (m/s) L-R Vel (m/s)  Median Acr Palm Anti Sensory (2nd Digit)  29.2C  Wrist *7.3 *7.5 0.2 14.4 10.2 29.2 Wrist Palm     Palm             Radial Anti Sensory (Base 1st Digit)  28.9C  Wrist 2.1 2.2 0.1 31.3 36.9 15.2 Wrist Base 1st Digit     Ulnar Anti Sensory (5th Digit)  29.5C  Wrist 3.4 3.5 0.1 19.5 17.1 12.3 Wrist 5th Digit 41 40 1   Motor Left/Right Comparison   Stim Site L Lat (ms) R Lat (ms) L-R Lat (ms) L Amp (mV) R Amp (mV) L-R Amp (%) Site1 Site2 L Vel (m/s) R Vel (m/s) L-R Vel (m/s)  Median Motor (Abd Poll Brev)  29.4C  Wrist *7.5 *8.4 *0.9 *4.6 *2.4 47.8 Elbow Wrist *46 *43 3  Elbow 12.1 13.3 1.2 4.3 2.3 46.5       Radial Motor (Ext Indicis)  29.6C    Ulnar  8cm *3.0   7.3   Up Arm 8cm 58    Up Arm 6.3   6.5   Axilla Up Arm 62    Axilla 7.9   6.3         Ulnar Motor (Abd Dig Min)  28.7C  Wrist  3.1   6.8  B Elbow Wrist  57   B Elbow  6.5   6.4  A Elbow B Elbow  71   A Elbow  7.9   5.8           Waveforms:

## 2022-04-10 ENCOUNTER — Encounter: Payer: Self-pay | Admitting: Orthopaedic Surgery

## 2022-04-10 ENCOUNTER — Ambulatory Visit (INDEPENDENT_AMBULATORY_CARE_PROVIDER_SITE_OTHER): Payer: Medicare Other | Admitting: Orthopaedic Surgery

## 2022-04-10 DIAGNOSIS — G5603 Carpal tunnel syndrome, bilateral upper limbs: Secondary | ICD-10-CM | POA: Diagnosis not present

## 2022-04-10 NOTE — Progress Notes (Signed)
Office Visit Note   Patient: Veronica [name]           Date of Birth: 1951-09-27           MRN: [id number] Visit Date: 04/10/2022              Requested by: Crist Infante, MD [address] [place],  [place] [address] PCP: Crist Infante, MD   Assessment & Plan: Visit Diagnoses:  1. Carpal tunnel syndrome, bilateral     Plan: EMGs nerve conduction studies demonstrate severe bilateral carpal tunnel syndrome with median nerve compression more on the right than the left.  Right-hand-dominant.  Long discussion regarding the findings and treatment options.  Have decided on open median nerve decompression.  Discussed the Bier block, incision, dressing and expected postoperative course.  Also discussed the fact that this was a fairly significant compression and that it may take time for for some of the numbness to return with a possibility of incomplete sensory return.  We will proceed  Follow-Up Instructions: Return We will schedule right carpal tunnel release.   Orders:  No orders of the defined types were placed in this encounter.  No orders of the defined types were placed in this encounter.     Procedures: No procedures performed   Clinical Data: No additional findings.   Subjective: Chief Complaint  Patient presents with   Left Hand - Follow-up   Right Hand - Follow-up   Patient presents today to discuss the results of her EMG of her bilateral hand. She states that she feels like her right hand has become worse. She has been having tingling and numbness however it is mostly noticed in her thumb, middle, and index finger. Patient is right hand dominate.   Review of Systems   Objective: Vital Signs: LMP 10/13/2002   Physical Exam Constitutional:      Appearance: She is well-developed.  Eyes:     Pupils: Pupils are equal, round, and reactive to light.  Pulmonary:     Effort: Pulmonary effort is normal.  Skin:    General: Skin is warm and dry.  Neurological:      Mental Status: She is alert and oriented to person, place, and time.  Psychiatric:        Behavior: Behavior normal.     Ortho Exam awake alert and oriented x3.  Comfortable sitting.  Some atrophy of the right and left thenar musculature.  Has good opposition of thumb little finger.  Has some decreased sensibility in the thumb index and long finger of the right hand subjectively.  Positive Tinel's and Phalen  Specialty Comments:  No specialty comments available.  Imaging: No results found.   PMFS History: Patient Active Problem List   Diagnosis Date Noted   Carpal tunnel syndrome, bilateral 02/25/2022   Stress incontinence 06/16/2021   Sleep choking syndrome 02/28/2020   Loud snoring 02/28/2020   Bilateral sensorineural hearing loss 12/10/2018   Excessive cerumen in right ear canal 12/10/2018   Family history of colonic polyps 11/03/2018   Abnormal TSH 08/10/2017   Hypersomnia, persistent 03/24/2013   UARS (upper airway resistance syndrome) 03/24/2013   Past Medical History:  Diagnosis Date   Erosive esophagitis    on nexium since, with good GERD relief   GERD (gastroesophageal reflux disease)    High cholesterol    Hypothyroid    Miscarriage    G3P2   PE (pulmonary embolism) 1977   secondary falling down stairs with subsequent renal failure  Shingles 2014   Vitamin B12 deficiency    due to small intestine resection, malabsorption , on shots   Vitamin D deficiency     Family History  Problem Relation Age of Onset   Transient ischemic attack Mother    Arthritis Mother    Hypertension Mother    Ulcers Mother    COPD Mother    Hypothyroidism Mother    Cancer - Lung Father    Hypertension Father    Diabetes Sister        whooping cough   Diabetes Paternal Aunt    Diabetes Paternal Uncle    Cancer Paternal Grandmother        esophageal   Hypertension Paternal Grandmother    Hypertension Maternal Grandmother    Heart disease Maternal Grandmother     Hypothyroidism Sister    Depression Other        maternal side   Depression Other        paternal side    Past Surgical History:  Procedure Laterality Date   APPENDECTOMY  1968   BOWEL RESECTION  1979   for scar tissue and bowel obstruction.near junction of colon and small bowel per patient   BUNIONECTOMY  2004   scars on both feet   LAPARASCOPY  1978   TUBAL LIGATION     Social History   Occupational History   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

## 2022-05-08 DIAGNOSIS — Z780 Asymptomatic menopausal state: Secondary | ICD-10-CM | POA: Diagnosis not present

## 2022-05-15 ENCOUNTER — Other Ambulatory Visit: Payer: Self-pay | Admitting: Orthopaedic Surgery

## 2022-05-15 DIAGNOSIS — G5601 Carpal tunnel syndrome, right upper limb: Secondary | ICD-10-CM | POA: Diagnosis not present

## 2022-05-15 MED ORDER — TRAMADOL HCL 50 MG PO TABS: 50.0000 mg | ORAL_TABLET | Freq: Three times a day (TID) | ORAL | 0 refills | Status: DC | PRN

## 2022-05-22 ENCOUNTER — Ambulatory Visit (INDEPENDENT_AMBULATORY_CARE_PROVIDER_SITE_OTHER): Payer: Medicare Other | Admitting: Physician Assistant

## 2022-05-22 ENCOUNTER — Encounter: Payer: Self-pay | Admitting: Physician Assistant

## 2022-05-22 DIAGNOSIS — G5603 Carpal tunnel syndrome, bilateral upper limbs: Secondary | ICD-10-CM

## 2022-05-22 NOTE — Progress Notes (Signed)
Office Visit Note   Patient: Veronica [name]           Date of Birth: Jul 19, 1951           MRN: [id number] Visit Date: 05/22/2022              Requested by: Crist Infante, MD [address] [place],  [place] [address] PCP: Crist Infante, MD  Chief Complaint  Patient presents with   Right Wrist - Routine Post Op      HPI: Patient is a pleasant 70 year old patient of Dr. Durward Fortes.  She is 1 week status post right carpal tunnel release.  She has no complaints she did not take any pain medication.  She denies any fever chills  Assessment & Plan: Visit Diagnoses: Carpal tunnel right wrist  Plan: She is doing very well.  She is going to come in next week at which time we will remove the sutures.  In the meantime she knows she still needs to use the wrist splint especially when she is out and about.  Follow-Up Instructions: No follow-ups on file.   Ortho Exam  Patient is alert, oriented, no adenopathy, well-dressed, normal affect, normal respiratory effort. Examination of her right wrist well-healing surgical incision no drainage no surrounding erythema no swelling.  Sutures are intact.  She has good opposition of her fingers.  She subjectively states that she has better feeling especially in her thumb.  Imaging: No results found. No images are attached to the encounter.  Labs: No results found for: "HGBA1C", "ESRSEDRATE", "CRP", "LABURIC", "REPTSTATUS", "GRAMSTAIN", "CULT", "LABORGA"   Lab Results  Component Value Date   ALBUMIN 4.5 11/04/2021   ALBUMIN 4.4 11/15/2019   ALBUMIN 4.5 03/08/2015    No results found for: "MG" Lab Results  Component Value Date   VD25OH 27 (L) 05/23/2016   VD25OH 31 03/08/2015    No results found for: "PREALBUMIN"    Latest Ref Rng & Units 05/23/2016    2:56 PM 05/23/2016    9:36 AM 03/08/2015    9:29 AM  CBC EXTENDED  WBC 3.8 - 10.8 K/uL 5.6     RBC 3.80 - 5.10 MIL/uL 4.31     Hemoglobin 11.7 - 15.5 g/dL 13.9  13.5  13.2   HCT 35.0 -  45.0 % 41.9     Platelets 140 - 400 K/uL 257        There is no height or weight on file to calculate BMI.  Orders:  No orders of the defined types were placed in this encounter.  No orders of the defined types were placed in this encounter.    Procedures: No procedures performed  Clinical Data: No additional findings.  ROS:  All other systems negative, except as noted in the HPI. Review of Systems  Objective: Vital Signs: LMP 10/13/2002   Specialty Comments:  No specialty comments available.  PMFS History: Patient Active Problem List   Diagnosis Date Noted   Carpal tunnel syndrome, bilateral 02/25/2022   Stress incontinence 06/16/2021   Sleep choking syndrome 02/28/2020   Loud snoring 02/28/2020   Bilateral sensorineural hearing loss 12/10/2018   Excessive cerumen in right ear canal 12/10/2018   Family history of colonic polyps 11/03/2018   Abnormal TSH 08/10/2017   Hypersomnia, persistent 03/24/2013   UARS (upper airway resistance syndrome) 03/24/2013   Past Medical History:  Diagnosis Date   Erosive esophagitis    on nexium since, with good GERD relief   GERD (gastroesophageal reflux disease)  High cholesterol    Hypothyroid    Miscarriage    G3P2   PE (pulmonary embolism) 1977   secondary falling down stairs with subsequent renal failure   Shingles 2014   Vitamin B12 deficiency    due to small intestine resection, malabsorption , on shots   Vitamin D deficiency     Family History  Problem Relation Age of Onset   Transient ischemic attack Mother    Arthritis Mother    Hypertension Mother    Ulcers Mother    COPD Mother    Hypothyroidism Mother    Cancer - Lung Father    Hypertension Father    Diabetes Sister        whooping cough   Diabetes Paternal Aunt    Diabetes Paternal Uncle    Cancer Paternal Grandmother        esophageal   Hypertension Paternal Grandmother    Hypertension Maternal Grandmother    Heart disease Maternal  Grandmother    Hypothyroidism Sister    Depression Other        maternal side   Depression Other        paternal side    Past Surgical History:  Procedure Laterality Date   APPENDECTOMY  1968   BOWEL RESECTION  1979   for scar tissue and bowel obstruction.near junction of colon and small bowel per patient   BUNIONECTOMY  2004   scars on both feet   LAPARASCOPY  1978   TUBAL LIGATION     Social History   Occupational History   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

## 2022-05-28 ENCOUNTER — Ambulatory Visit (INDEPENDENT_AMBULATORY_CARE_PROVIDER_SITE_OTHER): Payer: Medicare Other | Admitting: Physician Assistant

## 2022-05-28 ENCOUNTER — Encounter: Payer: Self-pay | Admitting: Physician Assistant

## 2022-05-28 DIAGNOSIS — G5603 Carpal tunnel syndrome, bilateral upper limbs: Secondary | ICD-10-CM

## 2022-05-28 NOTE — Progress Notes (Signed)
Office Visit Note   Patient: Veronica [name]           Date of Birth: October 22, 1950           MRN: [id number] Visit Date: 05/28/2022              Requested by: Crist Infante, MD [address] [place],  [place] [address] PCP: Crist Infante, MD  Chief Complaint  Patient presents with   Right Wrist - Routine Post Op      HPI: Patient is a pleasant 71 year old woman who is 2 weeks status post right carpal tunnel release with Dr. Durward Fortes.  She rates the pain as 0-1.  She has been using her splint when she is out and about.  Assessment & Plan: Visit Diagnoses:  1. Carpal tunnel syndrome, bilateral     Plan: Sutures were removed today.  She will follow-up in 2 weeks for a visit with Dr. Durward Fortes.  We talked about putting vitamin E oil on the incision which actually looks quite good.  I did place some Steri-Strips she can remove these in a few days.  Follow-Up Instructions: Return in about 2 weeks (around 06/11/2022).   Ortho Exam  Patient is alert, oriented, no adenopathy, well-dressed, normal affect, normal respiratory effort. Examination of her right wrist her hand is warm without swelling brisk capillary refill she has improved sensation to her thumb and index finger.  She can oppose all fingers.  Palpable radial pulse.  She has a well-healed surgical incision sutures were removed without difficulty today  Imaging: No results found. No images are attached to the encounter.  Labs: No results found for: "HGBA1C", "ESRSEDRATE", "CRP", "LABURIC", "REPTSTATUS", "GRAMSTAIN", "CULT", "LABORGA"   Lab Results  Component Value Date   ALBUMIN 4.5 11/04/2021   ALBUMIN 4.4 11/15/2019   ALBUMIN 4.5 03/08/2015    No results found for: "MG" Lab Results  Component Value Date   VD25OH 27 (L) 05/23/2016   VD25OH 31 03/08/2015    No results found for: "PREALBUMIN"    Latest Ref Rng & Units 05/23/2016    2:56 PM 05/23/2016    9:36 AM 03/08/2015    9:29 AM  CBC EXTENDED  WBC 3.8 -  10.8 K/uL 5.6     RBC 3.80 - 5.10 MIL/uL 4.31     Hemoglobin 11.7 - 15.5 g/dL 13.9  13.5  13.2   HCT 35.0 - 45.0 % 41.9     Platelets 140 - 400 K/uL 257        There is no height or weight on file to calculate BMI.  Orders:  No orders of the defined types were placed in this encounter.  No orders of the defined types were placed in this encounter.    Procedures: No procedures performed  Clinical Data: No additional findings.  ROS:  All other systems negative, except as noted in the HPI. Review of Systems  Objective: Vital Signs: LMP 10/13/2002   Specialty Comments:  No specialty comments available.  PMFS History: Patient Active Problem List   Diagnosis Date Noted   Carpal tunnel syndrome, bilateral 02/25/2022   Stress incontinence 06/16/2021   Sleep choking syndrome 02/28/2020   Loud snoring 02/28/2020   Bilateral sensorineural hearing loss 12/10/2018   Excessive cerumen in right ear canal 12/10/2018   Family history of colonic polyps 11/03/2018   Abnormal TSH 08/10/2017   Hypersomnia, persistent 03/24/2013   UARS (upper airway resistance syndrome) 03/24/2013   Past Medical History:  Diagnosis Date  Erosive esophagitis    on nexium since, with good GERD relief   GERD (gastroesophageal reflux disease)    High cholesterol    Hypothyroid    Miscarriage    G3P2   PE (pulmonary embolism) 1977   secondary falling down stairs with subsequent renal failure   Shingles 2014   Vitamin B12 deficiency    due to small intestine resection, malabsorption , on shots   Vitamin D deficiency     Family History  Problem Relation Age of Onset   Transient ischemic attack Mother    Arthritis Mother    Hypertension Mother    Ulcers Mother    COPD Mother    Hypothyroidism Mother    Cancer - Lung Father    Hypertension Father    Diabetes Sister        whooping cough   Diabetes Paternal Aunt    Diabetes Paternal Uncle    Cancer Paternal Grandmother        esophageal    Hypertension Paternal Grandmother    Hypertension Maternal Grandmother    Heart disease Maternal Grandmother    Hypothyroidism Sister    Depression Other        maternal side   Depression Other        paternal side    Past Surgical History:  Procedure Laterality Date   APPENDECTOMY  1968   BOWEL RESECTION  1979   for scar tissue and bowel obstruction.near junction of colon and small bowel per patient   BUNIONECTOMY  2004   scars on both feet   LAPARASCOPY  1978   TUBAL LIGATION     Social History   Occupational History   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

## 2022-06-11 ENCOUNTER — Encounter: Payer: Self-pay | Admitting: Orthopaedic Surgery

## 2022-06-11 ENCOUNTER — Ambulatory Visit (INDEPENDENT_AMBULATORY_CARE_PROVIDER_SITE_OTHER): Payer: Medicare Other | Admitting: Orthopaedic Surgery

## 2022-06-11 DIAGNOSIS — G5603 Carpal tunnel syndrome, bilateral upper limbs: Secondary | ICD-10-CM

## 2022-06-11 NOTE — Progress Notes (Signed)
Office Visit Note   Patient: Veronica [name]           Date of Birth: April 27, 1951           MRN: [id number] Visit Date: 06/11/2022              Requested by: Crist Infante, MD [address] [place],  [place] [address] PCP: Crist Infante, MD   Assessment & Plan: Visit Diagnoses:  1. Carpal tunnel syndrome, bilateral     Plan: 3 weeks status post right carpal tunnel release and doing very well.  Sensation has returned to the thumb and index finger.  There is a little "tingling" at the tip of the thumb.  Full range of motion.  No edema.  Incision is healing very nicely no longer needs the sling.  May use med derma or vitamin E on the scar.  Has evidence of carpal tunnel in the left wrist but not nearly as symptomatic as the right so we will wait on any specific treatment.  We will plan to see back as needed  Follow-Up Instructions: Return if symptoms worsen or fail to improve.   Orders:  No orders of the defined types were placed in this encounter.  No orders of the defined types were placed in this encounter.     Procedures: No procedures performed   Clinical Data: No additional findings.   Subjective: Chief Complaint  Patient presents with   Right Wrist - Routine Post Op  3 weeks status post right carpal tunnel release and doing very well.  No related problems  HPI  Review of Systems   Objective: Vital Signs: LMP 10/13/2002   Physical Exam  Ortho Exam Right carpal tunnel incision is healing without a problem.  There is some scar and firmness around the incision but no pain.  Normal sensation to the hand with the exception of some very mild tingling at the very tip of the thumb and index finger.  Full range of motion good capillary refill.  Good opposition of thumb to little finger with some atrophy of the thenar musculature Specialty Comments:  No specialty comments available.  Imaging: No results found.   PMFS History: Patient Active Problem List    Diagnosis Date Noted   Carpal tunnel syndrome, bilateral 02/25/2022   Stress incontinence 06/16/2021   Sleep choking syndrome 02/28/2020   Loud snoring 02/28/2020   Bilateral sensorineural hearing loss 12/10/2018   Excessive cerumen in right ear canal 12/10/2018   Family history of colonic polyps 11/03/2018   Abnormal TSH 08/10/2017   Hypersomnia, persistent 03/24/2013   UARS (upper airway resistance syndrome) 03/24/2013   Past Medical History:  Diagnosis Date   Erosive esophagitis    on nexium since, with good GERD relief   GERD (gastroesophageal reflux disease)    High cholesterol    Hypothyroid    Miscarriage    G3P2   PE (pulmonary embolism) 1977   secondary falling down stairs with subsequent renal failure   Shingles 2014   Vitamin B12 deficiency    due to small intestine resection, malabsorption , on shots   Vitamin D deficiency     Family History  Problem Relation Age of Onset   Transient ischemic attack Mother    Arthritis Mother    Hypertension Mother    Ulcers Mother    COPD Mother    Hypothyroidism Mother    Cancer - Lung Father    Hypertension Father    Diabetes Sister  whooping cough   Diabetes Paternal Aunt    Diabetes Paternal Uncle    Cancer Paternal Grandmother        esophageal   Hypertension Paternal Grandmother    Hypertension Maternal Grandmother    Heart disease Maternal Grandmother    Hypothyroidism Sister    Depression Other        maternal side   Depression Other        paternal side    Past Surgical History:  Procedure Laterality Date   APPENDECTOMY  1968   BOWEL RESECTION  1979   for scar tissue and bowel obstruction.near junction of colon and small bowel per patient   BUNIONECTOMY  2004   scars on both feet   LAPARASCOPY  1978   TUBAL LIGATION     Social History   Occupational History   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  Chart creation errors have been sought, but may not always  have been located. Such creation errors do not reflect on  the standard of medical care.

## 2022-06-12 ENCOUNTER — Encounter: Payer: Medicare Other | Admitting: Neurology

## 2022-07-29 DIAGNOSIS — R0981 Nasal congestion: Secondary | ICD-10-CM | POA: Diagnosis not present

## 2022-07-29 DIAGNOSIS — R059 Cough, unspecified: Secondary | ICD-10-CM | POA: Diagnosis not present

## 2022-07-29 DIAGNOSIS — Z1152 Encounter for screening for COVID-19: Secondary | ICD-10-CM | POA: Diagnosis not present

## 2022-07-29 DIAGNOSIS — J029 Acute pharyngitis, unspecified: Secondary | ICD-10-CM | POA: Diagnosis not present

## 2022-07-29 DIAGNOSIS — B349 Viral infection, unspecified: Secondary | ICD-10-CM | POA: Diagnosis not present

## 2022-07-29 DIAGNOSIS — R5383 Other fatigue: Secondary | ICD-10-CM | POA: Diagnosis not present

## 2022-09-08 DIAGNOSIS — F900 Attention-deficit hyperactivity disorder, predominantly inattentive type: Secondary | ICD-10-CM | POA: Diagnosis not present

## 2022-09-08 DIAGNOSIS — E039 Hypothyroidism, unspecified: Secondary | ICD-10-CM | POA: Diagnosis not present

## 2022-09-08 DIAGNOSIS — I700 Atherosclerosis of aorta: Secondary | ICD-10-CM | POA: Diagnosis not present

## 2022-09-08 DIAGNOSIS — Z23 Encounter for immunization: Secondary | ICD-10-CM | POA: Diagnosis not present

## 2022-09-08 DIAGNOSIS — K589 Irritable bowel syndrome without diarrhea: Secondary | ICD-10-CM | POA: Diagnosis not present

## 2022-09-08 DIAGNOSIS — R635 Abnormal weight gain: Secondary | ICD-10-CM | POA: Diagnosis not present

## 2022-09-08 DIAGNOSIS — K219 Gastro-esophageal reflux disease without esophagitis: Secondary | ICD-10-CM | POA: Diagnosis not present

## 2022-09-08 DIAGNOSIS — E538 Deficiency of other specified B group vitamins: Secondary | ICD-10-CM | POA: Diagnosis not present

## 2022-09-08 DIAGNOSIS — M2550 Pain in unspecified joint: Secondary | ICD-10-CM | POA: Diagnosis not present

## 2022-09-08 DIAGNOSIS — G629 Polyneuropathy, unspecified: Secondary | ICD-10-CM | POA: Diagnosis not present

## 2022-09-28 DIAGNOSIS — Z20822 Contact with and (suspected) exposure to covid-19: Secondary | ICD-10-CM | POA: Diagnosis not present

## 2022-09-28 DIAGNOSIS — R059 Cough, unspecified: Secondary | ICD-10-CM | POA: Diagnosis not present

## 2022-09-28 DIAGNOSIS — J029 Acute pharyngitis, unspecified: Secondary | ICD-10-CM | POA: Diagnosis not present

## 2022-11-06 ENCOUNTER — Ambulatory Visit (INDEPENDENT_AMBULATORY_CARE_PROVIDER_SITE_OTHER): Payer: Medicare Other | Admitting: Obstetrics & Gynecology

## 2022-11-06 ENCOUNTER — Encounter (HOSPITAL_BASED_OUTPATIENT_CLINIC_OR_DEPARTMENT_OTHER): Payer: Self-pay | Admitting: Obstetrics & Gynecology

## 2022-11-06 ENCOUNTER — Ambulatory Visit (HOSPITAL_BASED_OUTPATIENT_CLINIC_OR_DEPARTMENT_OTHER)
Admission: RE | Admit: 2022-11-06 | Discharge: 2022-11-06 | Disposition: A | Discharge status: Home / Self Care | Payer: Medicare Other | Source: Ambulatory Visit | Attending: Obstetrics & Gynecology | Admitting: Obstetrics & Gynecology

## 2022-11-06 VITALS — BP 122/67 | HR 68 | Ht 65.5 in | Wt 144.8 lb

## 2022-11-06 DIAGNOSIS — Z01411 Encounter for gynecological examination (general) (routine) with abnormal findings: Secondary | ICD-10-CM

## 2022-11-06 DIAGNOSIS — Z86711 Personal history of pulmonary embolism: Secondary | ICD-10-CM | POA: Diagnosis not present

## 2022-11-06 DIAGNOSIS — Z1231 Encounter for screening mammogram for malignant neoplasm of breast: Secondary | ICD-10-CM | POA: Insufficient documentation

## 2022-11-06 DIAGNOSIS — K219 Gastro-esophageal reflux disease without esophagitis: Secondary | ICD-10-CM | POA: Insufficient documentation

## 2022-11-06 DIAGNOSIS — R141 Gas pain: Secondary | ICD-10-CM | POA: Insufficient documentation

## 2022-11-06 DIAGNOSIS — Z780 Asymptomatic menopausal state: Secondary | ICD-10-CM

## 2022-11-06 NOTE — Progress Notes (Signed)
72 y.o. G2P2 Widowed White or Caucasian female here for breast and pelvic exam.  I am also following her hx of urinary incontinence.  Did do PT over a year ago.  Isn't doing her exercises so not as good as last year.  Denies vaginal bleeding.  Working on losing some weight.    Reports some issues with low blood pressure last year.    Patient's last menstrual period was 10/13/2002.          Sexually active: Yes.    H/O STD:  no  Health Maintenance: PCP:  Perini.  Last wellness appt was 09/11/2022.  Did blood work at that appt: yes Vaccines are up to date:  pt has not done rsv or shingles Colonoscopy:  02/17/2022, follow up 7 years MMG:  09/17/2020 Negative BMD:  03/2022 Last pap smear:  11/04/2021 Negative.   H/o abnormal pap smear:  no    reports that she has quit smoking. Her smoking use included cigarettes. She has a 1.00 pack-year smoking history. She has never used smokeless tobacco. She reports current alcohol use of about 2.0 standard drinks of alcohol per week. She reports that she does not use drugs.  Past Medical History:  Diagnosis Date   Erosive esophagitis    on nexium since, with good GERD relief   GERD (gastroesophageal reflux disease)    High cholesterol    Hypothyroid    Miscarriage    G3P2   PE (pulmonary embolism) 1977   secondary falling down stairs with subsequent renal failure   Shingles 2014   Vitamin B12 deficiency    due to small intestine resection, malabsorption , on shots   Vitamin D deficiency     Past Surgical History:  Procedure Laterality Date   [place]   for scar tissue and bowel obstruction.near junction of colon and small bowel per patient   BUNIONECTOMY  2004   scars on both feet   LAPARASCOPY  1978   TUBAL LIGATION      Current Outpatient Medications  Medication Sig Dispense Refill   Cyanocobalamin 1000 MCG SUBL Inject as directed. One injection every 2 weeks     zolpidem (AMBIEN) 5 MG tablet Take  2.5 mg by mouth at bedtime. Pt breaks '5mg'$  in half and takes prn  1   No current facility-administered medications for this visit.    Family History  Problem Relation Age of Onset   Transient ischemic attack Mother    Arthritis Mother    Hypertension Mother    Ulcers Mother    COPD Mother    Hypothyroidism Mother    Cancer - Lung Father    Hypertension Father    Diabetes Sister        whooping cough   Diabetes Paternal Aunt    Diabetes Paternal Uncle    Cancer Paternal Grandmother        esophageal   Hypertension Paternal Grandmother    Hypertension Maternal Grandmother    Heart disease Maternal Grandmother    Hypothyroidism Sister    Depression Other        maternal side   Depression Other        paternal side    Review of Systems  Constitutional: Negative.   Genitourinary: Negative.     Exam:   BP 122/67 (BP Location: Right Arm, Patient Position: Sitting, Cuff Size: Large)   Pulse 68   Ht 5' 5.5" (1.664 m) Comment: Reported  Wt 144 lb 12.8 oz (65.7 kg)   LMP 10/13/2002   BMI 23.73 kg/m   Height: 5' 5.5" (166.4 cm) (Reported)  General appearance: alert, cooperative and appears stated age Breasts: normal appearance, no masses or tenderness Abdomen: soft, non-tender; bowel sounds normal; no masses,  no organomegaly Lymph nodes: Cervical, supraclavicular, and axillary nodes normal.  No abnormal inguinal nodes palpated Neurologic: Grossly normal  Pelvic: External genitalia:  no lesions              Urethra:  normal appearing urethra with no masses, tenderness or lesions              Bartholins and Skenes: normal                 Vagina: normal appearing vagina with atrophic changes and no discharge, no lesions              Cervix: no lesions              Pap taken: No. Bimanual Exam:  Uterus:  normal size, contour, position, consistency, mobility, non-tender              Adnexa: normal adnexa and no mass, fullness, tenderness               Rectovaginal:  Confirms               Anus:  normal sphincter tone, no lesions  Chaperone, Octaviano Batty, CMA, was present for exam.  Assessment/Plan: 1. Encntr for gyn exam (general) (routine) w abnormal findings - Pap smear 2021 - Mammogram is due.  Will see if can do this today. - Colonoscopy 03/2022 - Bone mineral density is done with Dr. Joylene Draft - lab work done - vaccines reviewed/updated   2. Encounter for screening mammogram for malignant neoplasm of breast - MM 3D SCREEN BREAST BILATERAL; Future  3. Postmenopausal - no HR  4. History of pulmonary embolism -in 1997 due to a fall

## 2023-01-18 DIAGNOSIS — S0502XA Injury of conjunctiva and corneal abrasion without foreign body, left eye, initial encounter: Secondary | ICD-10-CM | POA: Diagnosis not present

## 2023-01-21 DIAGNOSIS — S0502XA Injury of conjunctiva and corneal abrasion without foreign body, left eye, initial encounter: Secondary | ICD-10-CM | POA: Diagnosis not present

## 2023-01-22 DIAGNOSIS — S0502XD Injury of conjunctiva and corneal abrasion without foreign body, left eye, subsequent encounter: Secondary | ICD-10-CM | POA: Diagnosis not present

## 2023-01-26 DIAGNOSIS — S0502XD Injury of conjunctiva and corneal abrasion without foreign body, left eye, subsequent encounter: Secondary | ICD-10-CM | POA: Diagnosis not present

## 2023-02-17 DIAGNOSIS — H524 Presbyopia: Secondary | ICD-10-CM | POA: Diagnosis not present

## 2023-02-17 DIAGNOSIS — H2513 Age-related nuclear cataract, bilateral: Secondary | ICD-10-CM | POA: Diagnosis not present

## 2023-02-23 DIAGNOSIS — L578 Other skin changes due to chronic exposure to nonionizing radiation: Secondary | ICD-10-CM | POA: Diagnosis not present

## 2023-02-23 DIAGNOSIS — D1801 Hemangioma of skin and subcutaneous tissue: Secondary | ICD-10-CM | POA: Diagnosis not present

## 2023-02-23 DIAGNOSIS — L821 Other seborrheic keratosis: Secondary | ICD-10-CM | POA: Diagnosis not present

## 2023-02-23 DIAGNOSIS — H02729 Madarosis of unspecified eye, unspecified eyelid and periocular area: Secondary | ICD-10-CM | POA: Diagnosis not present

## 2023-02-23 DIAGNOSIS — L570 Actinic keratosis: Secondary | ICD-10-CM | POA: Diagnosis not present

## 2023-02-23 DIAGNOSIS — L720 Epidermal cyst: Secondary | ICD-10-CM | POA: Diagnosis not present

## 2023-02-23 DIAGNOSIS — L661 Lichen planopilaris: Secondary | ICD-10-CM | POA: Diagnosis not present

## 2023-03-13 DIAGNOSIS — E785 Hyperlipidemia, unspecified: Secondary | ICD-10-CM | POA: Diagnosis not present

## 2023-03-13 DIAGNOSIS — E559 Vitamin D deficiency, unspecified: Secondary | ICD-10-CM | POA: Diagnosis not present

## 2023-03-13 DIAGNOSIS — K219 Gastro-esophageal reflux disease without esophagitis: Secondary | ICD-10-CM | POA: Diagnosis not present

## 2023-03-13 DIAGNOSIS — E039 Hypothyroidism, unspecified: Secondary | ICD-10-CM | POA: Diagnosis not present

## 2023-03-13 DIAGNOSIS — R7989 Other specified abnormal findings of blood chemistry: Secondary | ICD-10-CM | POA: Diagnosis not present

## 2023-03-13 DIAGNOSIS — E539 Vitamin B deficiency, unspecified: Secondary | ICD-10-CM | POA: Diagnosis not present

## 2023-03-20 DIAGNOSIS — E538 Deficiency of other specified B group vitamins: Secondary | ICD-10-CM | POA: Diagnosis not present

## 2023-03-20 DIAGNOSIS — E785 Hyperlipidemia, unspecified: Secondary | ICD-10-CM | POA: Diagnosis not present

## 2023-03-20 DIAGNOSIS — N39498 Other specified urinary incontinence: Secondary | ICD-10-CM | POA: Diagnosis not present

## 2023-03-20 DIAGNOSIS — Z23 Encounter for immunization: Secondary | ICD-10-CM | POA: Diagnosis not present

## 2023-03-20 DIAGNOSIS — F900 Attention-deficit hyperactivity disorder, predominantly inattentive type: Secondary | ICD-10-CM | POA: Diagnosis not present

## 2023-03-20 DIAGNOSIS — Z0000 Encounter for general adult medical examination without abnormal findings: Secondary | ICD-10-CM | POA: Diagnosis not present

## 2023-03-20 DIAGNOSIS — H6121 Impacted cerumen, right ear: Secondary | ICD-10-CM | POA: Diagnosis not present

## 2023-03-20 DIAGNOSIS — E559 Vitamin D deficiency, unspecified: Secondary | ICD-10-CM | POA: Diagnosis not present

## 2023-03-20 DIAGNOSIS — K589 Irritable bowel syndrome without diarrhea: Secondary | ICD-10-CM | POA: Diagnosis not present

## 2023-03-20 DIAGNOSIS — J439 Emphysema, unspecified: Secondary | ICD-10-CM | POA: Diagnosis not present

## 2023-03-20 DIAGNOSIS — I700 Atherosclerosis of aorta: Secondary | ICD-10-CM | POA: Diagnosis not present

## 2023-03-20 DIAGNOSIS — Z86711 Personal history of pulmonary embolism: Secondary | ICD-10-CM | POA: Diagnosis not present

## 2023-03-20 DIAGNOSIS — R82998 Other abnormal findings in urine: Secondary | ICD-10-CM | POA: Diagnosis not present

## 2023-04-22 DIAGNOSIS — J029 Acute pharyngitis, unspecified: Secondary | ICD-10-CM | POA: Diagnosis not present

## 2023-04-22 DIAGNOSIS — H6092 Unspecified otitis externa, left ear: Secondary | ICD-10-CM | POA: Diagnosis not present

## 2023-04-27 DIAGNOSIS — H6692 Otitis media, unspecified, left ear: Secondary | ICD-10-CM | POA: Diagnosis not present

## 2023-04-27 DIAGNOSIS — J019 Acute sinusitis, unspecified: Secondary | ICD-10-CM | POA: Diagnosis not present

## 2023-05-01 DIAGNOSIS — H7292 Unspecified perforation of tympanic membrane, left ear: Secondary | ICD-10-CM | POA: Diagnosis not present

## 2023-05-01 DIAGNOSIS — H6121 Impacted cerumen, right ear: Secondary | ICD-10-CM | POA: Diagnosis not present

## 2023-05-01 DIAGNOSIS — H9190 Unspecified hearing loss, unspecified ear: Secondary | ICD-10-CM | POA: Diagnosis not present

## 2023-05-01 DIAGNOSIS — H6690 Otitis media, unspecified, unspecified ear: Secondary | ICD-10-CM | POA: Diagnosis not present

## 2023-05-07 DIAGNOSIS — H7292 Unspecified perforation of tympanic membrane, left ear: Secondary | ICD-10-CM | POA: Diagnosis not present

## 2023-05-07 DIAGNOSIS — H6121 Impacted cerumen, right ear: Secondary | ICD-10-CM | POA: Diagnosis not present

## 2023-05-07 DIAGNOSIS — H6690 Otitis media, unspecified, unspecified ear: Secondary | ICD-10-CM | POA: Diagnosis not present

## 2023-05-07 DIAGNOSIS — H9190 Unspecified hearing loss, unspecified ear: Secondary | ICD-10-CM | POA: Diagnosis not present

## 2023-05-08 DIAGNOSIS — H9190 Unspecified hearing loss, unspecified ear: Secondary | ICD-10-CM | POA: Diagnosis not present

## 2023-05-08 DIAGNOSIS — H6121 Impacted cerumen, right ear: Secondary | ICD-10-CM | POA: Diagnosis not present

## 2023-05-08 DIAGNOSIS — H7292 Unspecified perforation of tympanic membrane, left ear: Secondary | ICD-10-CM | POA: Diagnosis not present

## 2023-05-28 DIAGNOSIS — H838X3 Other specified diseases of inner ear, bilateral: Secondary | ICD-10-CM | POA: Diagnosis not present

## 2023-05-28 DIAGNOSIS — H903 Sensorineural hearing loss, bilateral: Secondary | ICD-10-CM | POA: Diagnosis not present

## 2023-05-28 DIAGNOSIS — H7202 Central perforation of tympanic membrane, left ear: Secondary | ICD-10-CM | POA: Diagnosis not present

## 2023-11-09 DIAGNOSIS — J439 Emphysema, unspecified: Secondary | ICD-10-CM | POA: Diagnosis not present

## 2023-11-09 DIAGNOSIS — R001 Bradycardia, unspecified: Secondary | ICD-10-CM | POA: Diagnosis not present

## 2023-11-09 DIAGNOSIS — E785 Hyperlipidemia, unspecified: Secondary | ICD-10-CM | POA: Diagnosis not present

## 2024-01-13 ENCOUNTER — Encounter: Payer: Self-pay | Admitting: Internal Medicine

## 2024-01-13 ENCOUNTER — Ambulatory Visit: Payer: Medicare Other | Admitting: Internal Medicine

## 2024-01-13 VITALS — BP 126/74 | HR 60 | Ht 65.0 in | Wt 134.4 lb

## 2024-01-13 DIAGNOSIS — Z87891 Personal history of nicotine dependence: Secondary | ICD-10-CM | POA: Diagnosis not present

## 2024-01-13 DIAGNOSIS — Z7722 Contact with and (suspected) exposure to environmental tobacco smoke (acute) (chronic): Secondary | ICD-10-CM

## 2024-01-13 DIAGNOSIS — R0982 Postnasal drip: Secondary | ICD-10-CM

## 2024-01-13 DIAGNOSIS — R053 Chronic cough: Secondary | ICD-10-CM | POA: Diagnosis not present

## 2024-01-13 DIAGNOSIS — J432 Centrilobular emphysema: Secondary | ICD-10-CM

## 2024-01-13 DIAGNOSIS — K219 Gastro-esophageal reflux disease without esophagitis: Secondary | ICD-10-CM | POA: Diagnosis not present

## 2024-01-13 MED ORDER — FLUTICASONE PROPIONATE 50 MCG/ACT NA SUSP: 1.0000 | Freq: Every day | NASAL | 2 refills | Status: AC

## 2024-01-13 NOTE — Progress Notes (Signed)
 Veronica [name]    [id number]    [birth year]  Primary Care Physician:Perini, Loraine Leriche, MD  Referring Physician: Ronney Lion, NP [address] [place],  [place] [address] Reason for Consultation: emphysema Date of Consultation: 01/13/2024  Chief complaint:   Chief Complaint  Patient presents with   Consult    Patient wants to know progress of emphysema.     HPI: Veronica [name] is a 73 year old female with early-stage emphysema who presents with a chronic cough. Her daughter encouraged her to have the cough evaluated.  She has experienced a chronic, non-productive cough for approximately one year, occurring randomly throughout the day and sometimes waking her at night. No associated shortness of breath. Her daughter encouraged her to seek evaluation for the cough.  Recently, she has had increased exposure to outdoor environments due to putting her house on the market, which may have exacerbated her symptoms. She has a known allergy to pine but typically does not experience significant allergy symptoms. However, she has recently experienced itchy, watery eyes, a runny nose, and a sore throat, which she attributes to dust exposure while working in her attic.  She has early-stage emphysema, diagnosed incidentally during a CT scan for coronary calcium scoring in 2020. Her past medical history includes a brief smoking history and significant secondhand smoke exposure from her mother, who had COPD/emphysema. Two of her sisters were smokers; one had emphysema and COPD.  She manages her reflux with dietary modifications, having eliminated gluten, sugar, and alcohol, resulting in a 12-pound weight loss. She occasionally uses Ambien and receives vitamin B12 injections every two weeks.  Smoking history: passive smoke exposure in mother, herself remote minimal smoking history of 2 years.  Social History   Occupational History   Occupation: retired    Comment: Therapist, sports   Tobacco Use   Smoking status: Former    Current packs/day: 0.50    Average packs/day: 0.5 packs/day for 2.0 years (1.0 ttl pk-yrs)    Types: Cigarettes   Smokeless tobacco: Never   Tobacco comments:    quit in 1978  Vaping Use   Vaping status: Never Used  Substance and Sexual Activity   Alcohol use: Yes    Alcohol/week: 2.0 standard drinks of alcohol    Types: 2 Standard drinks or equivalent per week   Drug use: No   Sexual activity: Yes    Partners: Male    Birth control/protection: Surgical, Post-menopausal    Comment: BTL    Relevant family history:  Family History  Problem Relation Age of Onset   Transient ischemic attack Mother    Arthritis Mother    Hypertension Mother    Ulcers Mother    COPD Mother    Hypothyroidism Mother    Cancer - Lung Father    Hypertension Father    COPD Sister    Diabetes Sister        whooping cough   COPD Sister    Hypothyroidism Sister    Hypertension Maternal Grandmother    Heart disease Maternal Grandmother    Cancer Paternal Grandmother        esophageal   Hypertension Paternal Grandmother    Diabetes Paternal Aunt    Diabetes Paternal Uncle    Depression Other        maternal side   Depression Other        paternal side    Past Medical History:  Diagnosis Date   Erosive  esophagitis    on nexium since, with good GERD relief   GERD (gastroesophageal reflux disease)    High cholesterol    Hypothyroid    Miscarriage    G3P2   PE (pulmonary embolism) 1977   secondary falling down stairs with subsequent renal failure   Shingles 2014   Vitamin B12 deficiency    due to small intestine resection, malabsorption , on shots   Vitamin D deficiency     Past Surgical History:  Procedure Laterality Date   APPENDECTOMY  1968   BOWEL RESECTION  1979   for scar tissue and bowel obstruction.near junction of colon and small bowel per patient   BUNIONECTOMY  2004   scars on both feet   LAPARASCOPY  1978   TUBAL LIGATION        Physical Exam: Blood pressure 126/74, pulse 60, height 5\' 5"  (1.651 m), weight 134 lb 6.4 oz (61 kg), last menstrual period 10/13/2002, SpO2 100%. Gen:      No acute distress ENT:  mild cobblestoning, no nasal polyps, mucus membranes moist Lungs:    No increased respiratory effort, symmetric chest wall excursion, clear to auscultation bilaterally, no wheezes or crackles CV:         Regular rate and rhythm; no murmurs, rubs, or gallops.  No pedal edema Abd:      + bowel sounds; soft, non-tender; no distension MSK: no acute synovitis of DIP or PIP joints, no mechanics hands.  Skin:      Warm and dry; no rashes Neuro: normal speech, no focal facial asymmetry Psych: alert and oriented x3, normal mood and affect   Data Reviewed/Medical Decision Making:  Independent interpretation of tests: Imaging:  Review of patient's ct chest 2020 coronary calcium score images revealed centrilobular emphysema. The patient's images have been independently reviewed by me.    PFTs:  Labs: A1AT level previously noted to be negative  Immunization status:  Immunization History  Administered Date(s) Administered   Fluad Quad(high Dose 65+) 07/17/2022   Influenza-Unspecified 07/24/2021   PFIZER(Purple Top)SARS-COV-2 Vaccination 11/02/2019, 11/20/2019, 06/13/2020     I reviewed prior external note(s) from pulmonary, pcp  I reviewed the result(s) of the labs and imaging as noted above.   I have ordered pft    Assessment and Plan  Chronic Cough Chronic cough likely due to postnasal drainage or reflux, evidenced by throat cobblestoning. - Initiate fluticasone nasal spray once daily. Instruct on proper administration technique. - Consider antihistamine if symptoms persist after two weeks. - Follow up in three months to reassess cough and review pulmonary function test results.  Emphysema Emphysema incidentally found on CT, likely from past smoke exposure. Asymptomatic currently. - Order  pulmonary function testing for baseline lung function. - Continue smoking abstinence and active lifestyle.  Gastroesophageal Reflux Disease (GERD) Reflux controlled with dietary modifications. - Continue dietary management to control symptoms.   Return to Care: Return in about 3 months (around 04/13/2024).  Durel Salts, MD Pulmonary and Critical Care Medicine [facility] Office:[phone]  CC: Ronney Lion, NP

## 2024-01-13 NOTE — Patient Instructions (Addendum)
 It was a pleasure to see you today!  Please schedule follow up scheduled with myself in 3 months.  If my schedule is not open yet, we will contact you with a reminder closer to that time. Please call [phone] if you haven't heard from [place] a month before, and always call us sooner if issues or concerns arise. You can also send [place] a message through MyChart, but but aware that this is not to be used for urgent issues and it may take up to 5-7 days to receive a reply. Please be aware that you will likely be able to view your results before I have a chance to respond to them. Please give [place] 5 business days to respond to any non-urgent results.    VISIT SUMMARY:  Today, we discussed your chronic cough, which has been ongoing for about a year, and reviewed your history of early-stage emphysema and gastroesophageal reflux disease (GERD). We also talked about your recent symptoms of itchy, watery eyes, a runny nose, and a sore throat, likely due to dust exposure.  YOUR PLAN:  -CHRONIC COUGH: Your chronic cough is likely due to postnasal drainage or reflux. We will start you on fluticasone nasal spray once daily to help with the symptoms. If your symptoms do not improve after two weeks, consider taking an antihistamine. We will follow up in two months to reassess your cough and review the results of your pulmonary function test.  -EMPHYSEMA: Emphysema is a lung condition that causes shortness of breath and is often due to smoking or exposure to secondhand smoke. Although you are currently asymptomatic, we will order a pulmonary function test to establish a baseline for your lung function. Continue to avoid smoking and maintain an active lifestyle.  -GASTROESOPHAGEAL REFLUX DISEASE (GERD): GERD is a condition where stomach acid frequently flows back into the tube connecting your mouth and stomach, causing irritation. Your reflux is well-controlled with dietary modifications, so continue with your current  diet to manage your symptoms.  INSTRUCTIONS:  Please follow up in threemonths to reassess your cough and review the results of your pulmonary function test.  Flonase - 1 spray on each side of your nose twice a day for first week, then 1 spray on each side.   Instructions for use: If you also use a saline nasal spray or rinse, use that first. Position the head with the chin slightly tucked. Use the right hand to spray into the left nostril and the right hand to spray into the left nostril.   Point the bottle away from the septum of your nose (cartilage that divides the two sides of your nose).  Hold the nostril closed on the opposite side from where you will spray Spray once and gently sniff to pull the medicine into the higher parts of your nose.  Don't sniff too hard as the medicine will drain down the back of your throat instead. Repeat with a second spray on the same side if prescribed. Repeat on the other side of your nose.                   Contains text generated by [employer].                                 Contains text generated by [employer].

## 2024-01-13 NOTE — Progress Notes (Deleted)
 Veronica [name]    [id number]    05-04-1951  Primary Care Physician:Perini, Loraine Leriche, MD  Referring Physician: Ronney Lion, NP [address] [place],  [place] [address] Reason for Consultation: emphysema Date of Consultation: 01/13/2024  Chief complaint:   Chief Complaint  Patient presents with   Consult    Patient wants to know progress of emphysema.     HPI:  *** Social history:  Occupation: Exposures: Smoking history: passive smoke exposure in mother  Social History   Occupational History   Occupation: retired    Comment: Therapist, sports  Tobacco Use   Smoking status: Former    Current packs/day: 0.50    Average packs/day: 0.5 packs/day for 2.0 years (1.0 ttl pk-yrs)    Types: Cigarettes   Smokeless tobacco: Never   Tobacco comments:    quit in 1978  Vaping Use   Vaping status: Never Used  Substance and Sexual Activity   Alcohol use: Yes    Alcohol/week: 2.0 standard drinks of alcohol    Types: 2 Standard drinks or equivalent per week   Drug use: No   Sexual activity: Yes    Partners: Male    Birth control/protection: Surgical, Post-menopausal    Comment: BTL    Relevant family history: *** Family History  Problem Relation Age of Onset   Transient ischemic attack Mother    Arthritis Mother    Hypertension Mother    Ulcers Mother    COPD Mother    Hypothyroidism Mother    Cancer - Lung Father    Hypertension Father    Diabetes Sister        whooping cough   Diabetes Paternal Aunt    Diabetes Paternal Uncle    Cancer Paternal Grandmother        esophageal   Hypertension Paternal Grandmother    Hypertension Maternal Grandmother    Heart disease Maternal Grandmother    Hypothyroidism Sister    Depression Other        maternal side   Depression Other        paternal side    Past Medical History:  Diagnosis Date   Erosive esophagitis    on nexium since, with good GERD relief   GERD (gastroesophageal reflux disease)     High cholesterol    Hypothyroid    Miscarriage    G3P2   PE (pulmonary embolism) 1977   secondary falling down stairs with subsequent renal failure   Shingles 2014   Vitamin B12 deficiency    due to small intestine resection, malabsorption , on shots   Vitamin D deficiency     Past Surgical History:  Procedure Laterality Date   APPENDECTOMY  1968   BOWEL RESECTION  1979   for scar tissue and bowel obstruction.near junction of colon and small bowel per patient   BUNIONECTOMY  2004   scars on both feet   LAPARASCOPY  1978   TUBAL LIGATION       Physical Exam: Blood pressure 126/74, pulse 60, height 5\' 5"  (1.651 m), weight 134 lb 6.4 oz (61 kg), last menstrual period 10/13/2002, SpO2 100%. Gen:      No acute distress ENT:  ***no nasal polyps, mucus membranes moist Lungs:    No increased respiratory effort, symmetric chest wall excursion, clear to auscultation bilaterally, ***no wheezes or crackles CV:         Regular rate and rhythm; no murmurs, rubs, or gallops.  No  pedal edema Abd:      + bowel sounds; soft, non-tender; no distension MSK: no acute synovitis of DIP or PIP joints, no mechanics hands.  Skin:      Warm and dry; no rashes*** Neuro: normal speech, no focal facial asymmetry Psych: alert and oriented x3, normal mood and affect   Data Reviewed/Medical Decision Making:  Independent interpretation of tests: Imaging:  Review of patient's *** images revealed ***. The patient's images have been independently reviewed by me.    PFTs: I have personally reviewed the patient's PFTs and ***     No data to display          Labs: ***  Immunization status:  Immunization History  Administered Date(s) Administered   Fluad Quad(high Dose 65+) 07/17/2022   Influenza-Unspecified 07/24/2021   PFIZER(Purple Top)SARS-COV-2 Vaccination 11/02/2019, 11/20/2019, 06/13/2020     I reviewed prior external note(s) from ***  I reviewed the result(s) of the labs and imaging as  noted above.   I have ordered ***  Discussion of management or test interpretation with another colleague ***.   Assessment:  *** 1 or more chronic illnesses with severe exacerbation, progression, or side effects of treatment;  *** 1 acute or chronic illness or injury that poses a threat to life or bodily function  Plan/Recommendations:   We discussed disease management and progression at length today.   I spent *** minutes in the care of this patient today including pre-charting, chart review, review of results, face-to-face care, coordination of care and communication with consultants etc.).  99202  15-29 minutes or Straightforward MDM  19147 30-44 minutes or Low level MDM  82956 45-59 minutes or Moderate level MDM  21308 60-74 minutes or High level MDM   Return to Care: No follow-ups on file.  Durel Salts, MD Pulmonary and Critical Care Medicine [facility] [phone]  CC: Ronney Lion, NP

## 2024-02-24 ENCOUNTER — Other Ambulatory Visit (HOSPITAL_BASED_OUTPATIENT_CLINIC_OR_DEPARTMENT_OTHER): Payer: Self-pay | Admitting: Internal Medicine

## 2024-02-24 DIAGNOSIS — Z1231 Encounter for screening mammogram for malignant neoplasm of breast: Secondary | ICD-10-CM

## 2024-03-10 DIAGNOSIS — L821 Other seborrheic keratosis: Secondary | ICD-10-CM | POA: Diagnosis not present

## 2024-03-10 DIAGNOSIS — L570 Actinic keratosis: Secondary | ICD-10-CM | POA: Diagnosis not present

## 2024-03-10 DIAGNOSIS — L814 Other melanin hyperpigmentation: Secondary | ICD-10-CM | POA: Diagnosis not present

## 2024-03-10 DIAGNOSIS — H02729 Madarosis of unspecified eye, unspecified eyelid and periocular area: Secondary | ICD-10-CM | POA: Diagnosis not present

## 2024-03-10 DIAGNOSIS — L6611 Classic lichen planopilaris: Secondary | ICD-10-CM | POA: Diagnosis not present

## 2024-03-10 DIAGNOSIS — D485 Neoplasm of uncertain behavior of skin: Secondary | ICD-10-CM | POA: Diagnosis not present

## 2024-03-18 ENCOUNTER — Encounter (HOSPITAL_BASED_OUTPATIENT_CLINIC_OR_DEPARTMENT_OTHER): Payer: Self-pay | Admitting: Radiology

## 2024-03-18 ENCOUNTER — Ambulatory Visit (HOSPITAL_BASED_OUTPATIENT_CLINIC_OR_DEPARTMENT_OTHER)
Admission: RE | Admit: 2024-03-18 | Discharge: 2024-03-18 | Disposition: A | Discharge status: Home / Self Care | Source: Ambulatory Visit | Attending: Internal Medicine | Admitting: Internal Medicine

## 2024-03-18 DIAGNOSIS — Z1231 Encounter for screening mammogram for malignant neoplasm of breast: Secondary | ICD-10-CM | POA: Diagnosis not present

## 2024-04-05 DIAGNOSIS — H2513 Age-related nuclear cataract, bilateral: Secondary | ICD-10-CM | POA: Diagnosis not present

## 2024-04-05 DIAGNOSIS — H524 Presbyopia: Secondary | ICD-10-CM | POA: Diagnosis not present

## 2024-04-26 ENCOUNTER — Encounter: Payer: Self-pay | Admitting: Internal Medicine

## 2024-04-26 ENCOUNTER — Ambulatory Visit (INDEPENDENT_AMBULATORY_CARE_PROVIDER_SITE_OTHER): Admitting: Internal Medicine

## 2024-04-26 ENCOUNTER — Encounter

## 2024-04-26 ENCOUNTER — Ambulatory Visit: Admitting: Internal Medicine

## 2024-04-26 VITALS — BP 128/62 | HR 54 | Ht 65.5 in | Wt 138.8 lb

## 2024-04-26 DIAGNOSIS — Z87891 Personal history of nicotine dependence: Secondary | ICD-10-CM | POA: Diagnosis not present

## 2024-04-26 DIAGNOSIS — J432 Centrilobular emphysema: Secondary | ICD-10-CM | POA: Diagnosis not present

## 2024-04-26 DIAGNOSIS — G478 Other sleep disorders: Secondary | ICD-10-CM

## 2024-04-26 DIAGNOSIS — R0982 Postnasal drip: Secondary | ICD-10-CM

## 2024-04-26 LAB — PULMONARY FUNCTION TEST
DL/VA % pred: 95 %
DL/VA: 3.91 ml/min/mmHg/L
DLCO cor % pred: 80 %
DLCO cor: 16.09 ml/min/mmHg
DLCO unc % pred: 80 %
DLCO unc: 16.09 ml/min/mmHg
FEF 25-75 Post: 2.77 L/s
FEF 25-75 Pre: 2.39 L/s
FEF2575-%Change-Post: 15 %
FEF2575-%Pred-Post: 148 %
FEF2575-%Pred-Pre: 128 %
FEV1-%Change-Post: 1 %
FEV1-%Pred-Post: 105 %
FEV1-%Pred-Pre: 103 %
FEV1-Post: 2.42 L
FEV1-Pre: 2.38 L
FEV1FVC-%Change-Post: 0 %
FEV1FVC-%Pred-Pre: 108 %
FEV6-%Change-Post: 0 %
FEV6-%Pred-Post: 100 %
FEV6-%Pred-Pre: 99 %
FEV6-Post: 2.92 L
FEV6-Pre: 2.9 L
FEV6FVC-%Pred-Post: 104 %
FEV6FVC-%Pred-Pre: 104 %
FVC-%Change-Post: 0 %
FVC-%Pred-Post: 95 %
FVC-%Pred-Pre: 95 %
FVC-Post: 2.92 L
FVC-Pre: 2.9 L
Post FEV1/FVC ratio: 83 %
Post FEV6/FVC ratio: 100 %
Pre FEV1/FVC ratio: 82 %
Pre FEV6/FVC Ratio: 100 %
RV % pred: 92 %
RV: 2.11 L
TLC % pred: 95 %
TLC: 4.96 L

## 2024-04-26 NOTE — Progress Notes (Signed)
 Full PFT performed today.

## 2024-04-26 NOTE — Progress Notes (Deleted)
 Full PFT performed today.

## 2024-04-26 NOTE — Patient Instructions (Addendum)
 It was a pleasure to see you today!  Please schedule follow up with myself in 1 year.  If my schedule is not open yet, we will contact you with a reminder closer to that time. Please call [phone] if you haven't heard from us  a month before, and always call us  sooner if issues or concerns arise. You can also send us  a message through MyChart, but but aware that this is not to be used for urgent issues and it may take up to 5-7 days to receive a reply. Please be aware that you will likely be able to view your results before I have a chance to respond to them. Please give us  5 business days to respond to any non-urgent results.    I think your coughing is most likely related to post nasal drainage.   Continue the flonase  for this. If this is not sufficient you can add an over the counter anti-histamine such as cetirizine, levocetirizine, loratidine.   Your pulmonary function testing was completely normal.

## 2024-04-26 NOTE — Patient Instructions (Signed)
 Full PFT performed today.

## 2024-04-26 NOTE — Progress Notes (Signed)
 Veronica [name]    [id number]    1950/12/22  Primary Care Physician:Perini, Oneil, MD Date of Appointment: 04/26/2024 Established Patient Visit  Chief complaint:   Chief Complaint  Patient presents with   Follow-up    Pft     HPI: Veronica [name] is a 73 y.o. woman with history of smoking, shortness of breath and coughing.   Interval Updates: Here for follow up after PFTs.  Feels cough improved with flonase .  No shortness of breath.   I have reviewed the patient's family social and past medical history and updated as appropriate.   Past Medical History:  Diagnosis Date   Erosive esophagitis    on nexium since, with good GERD relief   GERD (gastroesophageal reflux disease)    High cholesterol    Hypothyroid    Miscarriage    G3P2   PE (pulmonary embolism) 1977   secondary falling down stairs with subsequent renal failure   Shingles 2014   Vitamin B12 deficiency    due to small intestine resection, malabsorption , on shots   Vitamin D  deficiency     Past Surgical History:  Procedure Laterality Date   APPENDECTOMY  1968   BOWEL RESECTION  1979   for scar tissue and bowel obstruction.near junction of colon and small bowel per patient   BUNIONECTOMY  2004   scars on both feet   LAPARASCOPY  1978   TUBAL LIGATION      Family History  Problem Relation Age of Onset   Transient ischemic attack Mother    Arthritis Mother    Hypertension Mother    Ulcers Mother    COPD Mother    Hypothyroidism Mother    Cancer - Lung Father    Hypertension Father    COPD Sister    Diabetes Sister        whooping cough   COPD Sister    Hypothyroidism Sister    Hypertension Maternal Grandmother    Heart disease Maternal Grandmother    Cancer Paternal Grandmother        esophageal   Hypertension Paternal Grandmother    Diabetes Paternal Aunt    Diabetes Paternal Uncle    Depression Other        maternal side   Depression Other        paternal side     Social History   Occupational History   Occupation: retired    Comment: Therapist, sports  Tobacco Use   Smoking status: Former    Current packs/day: 0.50    Average packs/day: 0.5 packs/day for 2.0 years (1.0 ttl pk-yrs)    Types: Cigarettes   Smokeless tobacco: Never   Tobacco comments:    quit in 1978  Vaping Use   Vaping status: Never Used  Substance and Sexual Activity   Alcohol  use: Yes    Alcohol /week: 2.0 standard drinks of alcohol     Types: 2 Standard drinks or equivalent per week   Drug use: No   Sexual activity: Yes    Partners: Male    Birth control/protection: Surgical, Post-menopausal    Comment: BTL     Physical Exam: Blood pressure 128/62, pulse (!) 54, height 5' 5.5 (1.664 m), weight 138 lb 12.8 oz (63 kg), last menstrual period 10/13/2002, SpO2 97%.  Gen:      No acute distress ENT:  no nasal polyps, mucus membranes moist Lungs:    No increased respiratory effort, symmetric chest  wall excursion, clear to auscultation bilaterally, no wheezes or crackles CV:         Regular rate and rhythm; no murmurs, rubs, or gallops.  No pedal edema   Data Reviewed: Imaging: I have personally reviewed the   PFTs:     Latest Ref Rng & Units 04/26/2024    9:44 AM  PFT Results  FVC-Pre L 2.90  P  FVC-Predicted Pre % 95  P  FVC-Post L 2.92  P  FVC-Predicted Post % 95  P  Pre FEV1/FVC % % 82  P  Post FEV1/FCV % % 83  P  FEV1-Pre L 2.38  P  FEV1-Predicted Pre % 103  P  FEV1-Post L 2.42  P  DLCO uncorrected ml/min/mmHg 16.09  P  DLCO UNC% % 80  P  DLCO corrected ml/min/mmHg 16.09  P  DLCO COR %Predicted % 80  P  DLVA Predicted % 95  P  TLC L 4.96  P  TLC % Predicted % 95  P  RV % Predicted % 92  P    P Preliminary result   I have personally reviewed the patient's PFTs and normal pulmonary function  Labs: Lab Results  Component Value Date   NA 142 11/15/2019   K 4.6 11/15/2019   CO2 20 11/15/2019   GLUCOSE 90 11/15/2019   BUN 17 11/15/2019    CREATININE 0.86 11/15/2019   CALCIUM 9.4 11/15/2019   GFRNONAA 70 11/15/2019   Lab Results  Component Value Date   WBC 5.6 05/23/2016   HGB 13.9 05/23/2016   HCT 41.9 05/23/2016   MCV 97.2 05/23/2016   PLT 257 05/23/2016    Immunization status: Immunization History  Administered Date(s) Administered   Fluad Quad(high Dose 65+) 07/17/2022   Influenza-Unspecified 07/24/2021   PFIZER(Purple Top)SARS-COV-2 Vaccination 11/02/2019, 11/20/2019, 06/13/2020    External Records Personally Reviewed:   Assessment:  Chronic Cough secondary to  Post nasal drainage GERD History of smoking Emphysema   Plan/Recommendations:  I think your coughing is most likely related to post nasal drainage.   Continue the flonase  for this. If this is not sufficient you can add an over the counter anti-histamine such as cetirizine, levocetirizine, loratidine.   Your pulmonary function testing was completely normal.    Return to Care: Return in about 1 year (around 04/26/2025).   Verdon Gore, MD Pulmonary and Critical Care Medicine [facility] Office:[phone]

## 2024-04-27 ENCOUNTER — Ambulatory Visit: Payer: Self-pay | Admitting: Internal Medicine

## 2024-06-01 ENCOUNTER — Ambulatory Visit (INDEPENDENT_AMBULATORY_CARE_PROVIDER_SITE_OTHER): Payer: Medicare Other | Admitting: Otolaryngology

## 2024-06-24 DIAGNOSIS — K219 Gastro-esophageal reflux disease without esophagitis: Secondary | ICD-10-CM | POA: Diagnosis not present

## 2024-06-24 DIAGNOSIS — E538 Deficiency of other specified B group vitamins: Secondary | ICD-10-CM | POA: Diagnosis not present

## 2024-06-24 DIAGNOSIS — E039 Hypothyroidism, unspecified: Secondary | ICD-10-CM | POA: Diagnosis not present

## 2024-06-24 DIAGNOSIS — E785 Hyperlipidemia, unspecified: Secondary | ICD-10-CM | POA: Diagnosis not present

## 2024-06-24 DIAGNOSIS — E559 Vitamin D deficiency, unspecified: Secondary | ICD-10-CM | POA: Diagnosis not present

## 2024-06-28 ENCOUNTER — Ambulatory Visit (INDEPENDENT_AMBULATORY_CARE_PROVIDER_SITE_OTHER): Admitting: Otolaryngology

## 2024-06-28 ENCOUNTER — Encounter (INDEPENDENT_AMBULATORY_CARE_PROVIDER_SITE_OTHER): Payer: Self-pay | Admitting: Otolaryngology

## 2024-06-28 VITALS — BP 102/63 | HR 68 | Temp 98.0°F

## 2024-06-28 DIAGNOSIS — H6121 Impacted cerumen, right ear: Secondary | ICD-10-CM

## 2024-06-28 DIAGNOSIS — H903 Sensorineural hearing loss, bilateral: Secondary | ICD-10-CM

## 2024-06-28 DIAGNOSIS — H608X1 Other otitis externa, right ear: Secondary | ICD-10-CM

## 2024-06-28 MED ORDER — MOMETASONE FUROATE 0.1 % EX CREA: TOPICAL_CREAM | CUTANEOUS | 3 refills | Status: AC

## 2024-06-28 NOTE — Progress Notes (Unsigned)
 Patient ID: Veronica [name], female   DOB: [birth year], 73 y.o.   MRN: [id number]  Follow up: Left tympanic membrane perforation, hearing loss  HPI: 8/24 The patient is a 73 year old female who presents today complaining of left tympanic membrane perforation and hearing loss.  According to the patient, she noted increasing nasal congestion and left ear pain 1 month ago.  Her left ear hearing was also muffled at that time.  She was evaluated at an urgent care center, and was diagnosed with left ear infection and left tympanic membrane rupture.  She was treated with antibiotic eardrops and oral doxycycline.  The otalgia and otorrhea have resolved.  She has no previous ENT surgery.    The patient's review of systems (constitutional, eyes, ENT, cardiovascular, respiratory, GI, musculoskeletal, skin, neurologic, psychiatric, endocrine, hematologic, allergic) is noted in the ROS questionnaire.  It is reviewed with the patient.   Major events: None.     Ongoing medical problems:  Mild Emphysema, reflux, arthritis, Linchen Plantus.     Family health history: Diabetes, heart disease.     Social history: The patient is a widow. She is former smoker. She drinks alcohol  on occasions. She denies the use of illegal drugs.     Objective Objective note General: Communicates without difficulty, well nourished, no acute distress. Head: Normocephalic, no evidence injury, no tenderness, facial buttresses intact without stepoff. Face/sinus: No tenderness to palpation and percussion. Facial movement is normal and symmetric. Eyes: PERRL, EOMI. No scleral icterus, conjunctivae clear. Neuro: CN II exam reveals vision grossly intact.  No nystagmus at any point of gaze. Ears: Auricles well formed without lesions.  Ear canals are intact without mass or lesion.  No erythema or edema is appreciated.  The TMs are intact but opaque. Nose: External evaluation reveals normal support and skin without lesions.  Dorsum is  intact.  Anterior rhinoscopy reveals congested mucosa over anterior aspect of inferior turbinates and intact septum.  No purulence noted. Oral:  Oral cavity and oropharynx are intact, symmetric, without erythema or edema.  Mucosa is moist without lesions. Neck: Full range of motion without pain.  There is no significant lymphadenopathy.  No masses palpable.  Thyroid  bed within normal limits to palpation.  Parotid glands and submandibular glands equal bilaterally without mass.  Trachea is midline. Neuro:  CN 2-12 grossly intact.     AUDIOMETRIC TESTING: I have read and reviewed the audiometric test, which shows bilateral high-frequency sensorineural hearing loss. The speech reception threshold is 25dB AD and 25dB AS. The discrimination score is 100% AD and 100% AS.    Observations Functional status No functional status recorded  Cognitive status No cognitive status recorded  Assessment Assessment note 1.  Bilateral symmetric high-frequency sensorineural hearing loss, likely secondary to presbycusis.  2.  No tympanic membrane perforation is noted today.  Her left tympanic membrane perforation has healed.  Her recent infection has resolved.   Screenings/Interventions/Assessments No screenings/interventions/assessments recorded  Diagnoses attached to encounter No diagnoses attached  Plan Plan note 1.  The physical exam findings and the hearing test results are reviewed with the patient.  2.  The patient is reassured that no acute infection is noted today.  3.  The patient is a candidate for hearing amplification.  The hearing aid options are discussed.   4.  The patient will return for reevaluation in 1 year.

## 2024-06-29 DIAGNOSIS — H608X1 Other otitis externa, right ear: Secondary | ICD-10-CM | POA: Insufficient documentation

## 2024-06-30 DIAGNOSIS — E039 Hypothyroidism, unspecified: Secondary | ICD-10-CM | POA: Diagnosis not present

## 2024-06-30 DIAGNOSIS — I700 Atherosclerosis of aorta: Secondary | ICD-10-CM | POA: Diagnosis not present

## 2024-06-30 DIAGNOSIS — F900 Attention-deficit hyperactivity disorder, predominantly inattentive type: Secondary | ICD-10-CM | POA: Diagnosis not present

## 2024-06-30 DIAGNOSIS — K589 Irritable bowel syndrome without diarrhea: Secondary | ICD-10-CM | POA: Diagnosis not present

## 2024-06-30 DIAGNOSIS — E538 Deficiency of other specified B group vitamins: Secondary | ICD-10-CM | POA: Diagnosis not present

## 2024-06-30 DIAGNOSIS — Z86711 Personal history of pulmonary embolism: Secondary | ICD-10-CM | POA: Diagnosis not present

## 2024-06-30 DIAGNOSIS — J439 Emphysema, unspecified: Secondary | ICD-10-CM | POA: Diagnosis not present

## 2024-06-30 DIAGNOSIS — Z0000 Encounter for general adult medical examination without abnormal findings: Secondary | ICD-10-CM | POA: Diagnosis not present

## 2024-06-30 DIAGNOSIS — F4320 Adjustment disorder, unspecified: Secondary | ICD-10-CM | POA: Diagnosis not present

## 2024-06-30 DIAGNOSIS — E785 Hyperlipidemia, unspecified: Secondary | ICD-10-CM | POA: Diagnosis not present

## 2024-06-30 DIAGNOSIS — R82998 Other abnormal findings in urine: Secondary | ICD-10-CM | POA: Diagnosis not present

## 2024-06-30 DIAGNOSIS — E559 Vitamin D deficiency, unspecified: Secondary | ICD-10-CM | POA: Diagnosis not present

## 2024-06-30 DIAGNOSIS — G629 Polyneuropathy, unspecified: Secondary | ICD-10-CM | POA: Diagnosis not present

## 2024-06-30 DIAGNOSIS — G3184 Mild cognitive impairment, so stated: Secondary | ICD-10-CM | POA: Diagnosis not present

## 2024-07-04 DIAGNOSIS — L570 Actinic keratosis: Secondary | ICD-10-CM | POA: Diagnosis not present

## 2024-07-04 DIAGNOSIS — L723 Sebaceous cyst: Secondary | ICD-10-CM | POA: Diagnosis not present

## 2024-07-04 DIAGNOSIS — L814 Other melanin hyperpigmentation: Secondary | ICD-10-CM | POA: Diagnosis not present

## 2024-08-18 DIAGNOSIS — F900 Attention-deficit hyperactivity disorder, predominantly inattentive type: Secondary | ICD-10-CM | POA: Diagnosis not present

## 2024-08-18 DIAGNOSIS — G3184 Mild cognitive impairment, so stated: Secondary | ICD-10-CM | POA: Diagnosis not present

## 2024-08-18 DIAGNOSIS — Z23 Encounter for immunization: Secondary | ICD-10-CM | POA: Diagnosis not present

## 2024-08-18 DIAGNOSIS — F4320 Adjustment disorder, unspecified: Secondary | ICD-10-CM | POA: Diagnosis not present

## 2024-08-18 DIAGNOSIS — R4181 Age-related cognitive decline: Secondary | ICD-10-CM | POA: Diagnosis not present

## 2024-09-12 DIAGNOSIS — L570 Actinic keratosis: Secondary | ICD-10-CM | POA: Diagnosis not present

## 2024-09-12 DIAGNOSIS — L720 Epidermal cyst: Secondary | ICD-10-CM | POA: Diagnosis not present

## 2024-11-16 ENCOUNTER — Other Ambulatory Visit (HOSPITAL_COMMUNITY): Payer: Self-pay | Admitting: Psychiatry

## 2024-11-16 DIAGNOSIS — F0280 Dementia in other diseases classified elsewhere without behavioral disturbance: Secondary | ICD-10-CM

## 2024-11-29 ENCOUNTER — Encounter (HOSPITAL_COMMUNITY)
